# Patient Record
Sex: Male | Born: 1990 | Race: White | Hispanic: No | Marital: Married | Smoking: Never smoker
Health system: Southern US, Community
[De-identification: ages and names within clinical notes are randomized; demographics above are authoritative.]

## PROBLEM LIST (undated history)

## (undated) DIAGNOSIS — F419 Anxiety disorder, unspecified: Secondary | ICD-10-CM

## (undated) HISTORY — PX: LUMBAR SPINE SURGERY: SHX701

## (undated) HISTORY — PX: OTHER SURGICAL HISTORY: SHX169

## (undated) HISTORY — DX: Anxiety disorder, unspecified: F41.9

---

## 2012-11-22 ENCOUNTER — Encounter: Payer: Self-pay | Admitting: Family Medicine

## 2012-11-24 ENCOUNTER — Encounter: Payer: Self-pay | Admitting: Family Medicine

## 2013-11-24 ENCOUNTER — Emergency Department (HOSPITAL_COMMUNITY)
Admission: EM | Admit: 2013-11-24 | Discharge: 2013-11-24 | Disposition: A | Discharge status: Home / Self Care | Payer: Self-pay | Attending: Emergency Medicine | Admitting: Emergency Medicine

## 2013-11-24 ENCOUNTER — Encounter (HOSPITAL_COMMUNITY): Payer: Self-pay | Admitting: Emergency Medicine

## 2013-11-24 DIAGNOSIS — L03116 Cellulitis of left lower limb: Secondary | ICD-10-CM

## 2013-11-24 DIAGNOSIS — L02419 Cutaneous abscess of limb, unspecified: Secondary | ICD-10-CM | POA: Insufficient documentation

## 2013-11-24 DIAGNOSIS — L03119 Cellulitis of unspecified part of limb: Principal | ICD-10-CM

## 2013-11-24 MED ORDER — SULFAMETHOXAZOLE-TRIMETHOPRIM 800-160 MG PO TABS: 1.0000 | ORAL_TABLET | Freq: Two times a day (BID) | ORAL | Status: DC

## 2013-11-24 MED ORDER — CEPHALEXIN 500 MG PO CAPS: 500.0000 mg | ORAL_CAPSULE | Freq: Four times a day (QID) | ORAL | Status: DC

## 2013-11-24 MED ORDER — SULFAMETHOXAZOLE-TMP DS 800-160 MG PO TABS
1.0000 | ORAL_TABLET | Freq: Once | ORAL | Status: AC
  Administered 2013-11-24: 1 via ORAL
  Filled 2013-11-24: qty 1

## 2013-11-24 MED ORDER — CEPHALEXIN 250 MG PO CAPS
500.0000 mg | ORAL_CAPSULE | Freq: Once | ORAL | Status: AC
  Administered 2013-11-24: 500 mg via ORAL
  Filled 2013-11-24: qty 2

## 2013-11-24 NOTE — ED Provider Notes (Signed)
CSN: [id number]     Arrival date & time 11/24/13  1331 History   First MD Initiated Contact with Patient 11/24/13 1356    This chart was scribed for Francee Piccolo PA-C, a non-physician practitioner working with No att. providers found by Lewanda Rife, ED Scribe. This patient was seen in room TR09C/TR09C and the patient's care was started at 4:09 PM     Chief Complaint  Patient presents with  . Abscess     (Consider location/radiation/quality/duration/timing/severity/associated sxs/prior Treatment) The history is provided by the patient. No language interpreter was used.   HPI Comments: Matthew Andrews is a 23 y.o. male who presents to the Emergency Department complaining of abscess to left thigh onset 2 days. States he was seen at [facility] last night for the same. Reports associated occasional mild sanguinous drainage, and worsening redness. Denies any aggravating or alleviating factors. Denies associated insect bites, cough, fever, and chills. States he was prescribed an abx, but lost his prescription. Denies PMHx HIV, cancer, and DM.  History reviewed. No pertinent past medical history. No past surgical history on file. No family history on file. History  Substance Use Topics  . Smoking status: Not on file  . Smokeless tobacco: Not on file  . Alcohol Use: Not on file    Review of Systems  Constitutional: Negative for fever and chills.  Skin: Positive for color change.  All other systems reviewed and are negative.     Allergies  Review of patient's allergies indicates no known allergies.  Home Medications   Current Outpatient Rx  Name  Route  Sig  Dispense  Refill  . ibuprofen (ADVIL,MOTRIN) 200 MG tablet   Oral   Take 400-800 mg by mouth every 6 (six) hours as needed for fever, headache, mild pain, moderate pain or cramping.         . cephALEXin (KEFLEX) 500 MG capsule   Oral   Take 1 capsule (500 mg total) by mouth 4 (four) times daily.   40 capsule    0   . sulfamethoxazole-trimethoprim (SEPTRA DS) 800-160 MG per tablet   Oral   Take 1 tablet by mouth every 12 (twelve) hours.   20 tablet   0    BP 121/77  Pulse 85  Temp(Src) 98.5 F (36.9 C) (Oral)  Resp 20  SpO2 100% Physical Exam  Nursing note and vitals reviewed. Constitutional: He is oriented to person, place, and time. He appears well-developed and well-nourished. No distress.  HENT:  Head: Normocephalic and atraumatic.  Right Ear: External ear normal.  Left Ear: External ear normal.  Nose: Nose normal.  Mouth/Throat: Oropharynx is clear and moist.  Eyes: Conjunctivae are normal.  Neck: Normal range of motion. Neck supple.  Cardiovascular: Normal rate, regular rhythm, normal heart sounds and intact distal pulses.   Pulmonary/Chest: Effort normal and breath sounds normal. No respiratory distress. He has no wheezes. He has no rales.  Abdominal: Soft.  Musculoskeletal: Normal range of motion.  Lymphadenopathy:       Left: No inguinal adenopathy present.  Neurological: He is alert and oriented to person, place, and time.  Skin: Skin is warm and dry. He is not diaphoretic. There is erythema.  14 cm by 12 cm erythematous warm area on anterior left upper thigh. Non-TTP. No induration or fluctuance.   Psychiatric: He has a normal mood and affect.    ED Course  Procedures (including critical care time) COORDINATION OF CARE:  Nursing notes reviewed. Vital signs reviewed. Initial  pt interview and examination performed.   Filed Vitals:   11/24/13 1352  BP: 121/77  Pulse: 85  Temp: 98.5 F (36.9 C)  TempSrc: Oral  Resp: 20  SpO2: 100%    4:09 PM-Discussed work up plan with pt at bedside, which includes No orders of the defined types were placed in this encounter.  . Pt agrees with plan.   Treatment plan initiated: Medications  cephALEXin (KEFLEX) capsule 500 mg (500 mg Oral Given 11/24/13 1422)  sulfamethoxazole-trimethoprim (BACTRIM DS) 800-160 MG per tablet 1  tablet (1 tablet Oral Given 11/24/13 1422)     Initial diagnostic testing ordered.      Labs Review Labs Reviewed - No data to display Imaging Review No results found.   EKG Interpretation None      MDM   Final diagnoses:  Cellulitis of left leg    Filed Vitals:   11/24/13 1352  BP: 121/77  Pulse: 85  Temp: 98.5 F (36.9 C)  Resp: 20   Afebrile, NAD, non-toxic appearing, AAOx4.  Suspect uncomplicated cellulitis based on limited area of involvement, minimal pain, no systemic signs of illness (eg, fever, chills, dehydration, altered mental status, tachypnea, tachycardia, hypotension), no risk factors for serious illness (eg, extremes of age, general debility, immunocompromised status).   PE reveals redness, swelling, mildly tender, warm to touch. Skin intact, No bleeding. No bullae. Non purulent. Non circumferential.  Borders are not elevated or sharply demarcated.  Drew a line around the area of infection. Pt was instructed to return to the ED if area surpasses the boarder or pain intensifies.    Will prescribed Bactrim and Keflex to cover for MRSA, direct pt to apply warm compresses and to return to ED for I&D if pain should increase or abscess should develop. Advised return in 2 days for recheck as long as not worsening. Patient is agreeable to plan. Patient is stable at time of discharge   I personally performed the services described in this documentation, which was scribed in my presence. The recorded information has been reviewed and is accurate.       Jeannetta EllisJennifer L Domonic H[place]ox, PA-C 11/24/13 1615

## 2013-11-24 NOTE — Discharge Instructions (Signed)
Please follow up with your primary care physician in 1-2 days for a wound recheck. Or return earlier if you develop worsening redness past the line drawn. If you do not have one please call the [facility] number listed above. Please take your antibiotic until completion. Please read all discharge instructions and return precautions.   Cellulitis Cellulitis is an infection of the skin and the tissue beneath it. The infected area is usually red and tender. Cellulitis occurs most often in the arms and lower legs.  CAUSES  Cellulitis is caused by bacteria that enter the skin through cracks or cuts in the skin. The most common types of bacteria that cause cellulitis are Staphylococcus and Streptococcus. SYMPTOMS   Redness and warmth.  Swelling.  Tenderness or pain.  Fever. DIAGNOSIS  Your caregiver can usually determine what is wrong based on a physical exam. Blood tests may also be done. TREATMENT  Treatment usually involves taking an antibiotic medicine. HOME CARE INSTRUCTIONS   Take your antibiotics as directed. Finish them even if you start to feel better.  Keep the infected arm or leg elevated to reduce swelling.  Apply a warm cloth to the affected area up to 4 times per day to relieve pain.  Only take over-the-counter or prescription medicines for pain, discomfort, or fever as directed by your caregiver.  Keep all follow-up appointments as directed by your caregiver. SEEK MEDICAL CARE IF:   You notice red streaks coming from the infected area.  Your red area gets larger or turns dark in color.  Your bone or joint underneath the infected area becomes painful after the skin has healed.  Your infection returns in the same area or another area.  You notice a swollen bump in the infected area.  You develop new symptoms. SEEK IMMEDIATE MEDICAL CARE IF:   You have a fever.  You feel very sleepy.  You develop vomiting or diarrhea.  You have a general  ill feeling (malaise) with muscle aches and pains. MAKE SURE YOU:   Understand these instructions.  Will watch your condition.  Will get help right away if you are not doing well or get worse. Document Released: 05/13/2005 Document Revised: 02/02/2012 Document Reviewed: 10/19/2011 [facility] Patient Information 2014 [place].

## 2013-11-24 NOTE — ED Notes (Signed)
Abscess left thigh (his second in one month). [facility] and was given an Rx for abx but pt lost the Rx.

## 2013-11-24 NOTE — ED Notes (Signed)
Pt in c/o possible abscess to left thigh, states he was [facility] for same and prescribed an antibiotic but lost his prescription, in today due to continued pain

## 2013-11-27 NOTE — ED Provider Notes (Signed)
Medical screening examination/treatment/procedure(s) were performed by non-physician practitioner and as supervising physician I was immediately available for consultation/collaboration.   EKG Interpretation None        Gwyneth SproutWhitney Cambelle Suchecki, MD 11/27/13 0745

## 2017-04-26 DIAGNOSIS — M47812 Spondylosis without myelopathy or radiculopathy, cervical region: Secondary | ICD-10-CM | POA: Insufficient documentation

## 2017-04-26 DIAGNOSIS — M4306 Spondylolysis, lumbar region: Secondary | ICD-10-CM | POA: Insufficient documentation

## 2017-06-29 DIAGNOSIS — R319 Hematuria, unspecified: Secondary | ICD-10-CM | POA: Diagnosis not present

## 2017-06-29 DIAGNOSIS — R1011 Right upper quadrant pain: Secondary | ICD-10-CM | POA: Diagnosis not present

## 2018-01-10 DIAGNOSIS — Z5321 Procedure and treatment not carried out due to patient leaving prior to being seen by health care provider: Secondary | ICD-10-CM | POA: Diagnosis not present

## 2018-01-10 DIAGNOSIS — R109 Unspecified abdominal pain: Secondary | ICD-10-CM | POA: Diagnosis not present

## 2018-02-01 DIAGNOSIS — K529 Noninfective gastroenteritis and colitis, unspecified: Secondary | ICD-10-CM | POA: Diagnosis not present

## 2018-02-01 DIAGNOSIS — R51 Headache: Secondary | ICD-10-CM | POA: Diagnosis not present

## 2018-02-01 DIAGNOSIS — Z79899 Other long term (current) drug therapy: Secondary | ICD-10-CM | POA: Diagnosis not present

## 2018-03-10 DIAGNOSIS — W19XXXA Unspecified fall, initial encounter: Secondary | ICD-10-CM | POA: Diagnosis not present

## 2018-03-10 DIAGNOSIS — S299XXA Unspecified injury of thorax, initial encounter: Secondary | ICD-10-CM | POA: Diagnosis not present

## 2018-03-10 DIAGNOSIS — E7800 Pure hypercholesterolemia, unspecified: Secondary | ICD-10-CM | POA: Diagnosis not present

## 2018-03-10 DIAGNOSIS — R0781 Pleurodynia: Secondary | ICD-10-CM | POA: Diagnosis not present

## 2018-03-10 DIAGNOSIS — R0789 Other chest pain: Secondary | ICD-10-CM | POA: Diagnosis not present

## 2018-03-10 DIAGNOSIS — Z79899 Other long term (current) drug therapy: Secondary | ICD-10-CM | POA: Diagnosis not present

## 2018-05-02 DIAGNOSIS — Z79899 Other long term (current) drug therapy: Secondary | ICD-10-CM | POA: Diagnosis not present

## 2018-05-02 DIAGNOSIS — G43909 Migraine, unspecified, not intractable, without status migrainosus: Secondary | ICD-10-CM | POA: Diagnosis not present

## 2018-05-02 DIAGNOSIS — R51 Headache: Secondary | ICD-10-CM | POA: Diagnosis not present

## 2018-05-02 DIAGNOSIS — G43009 Migraine without aura, not intractable, without status migrainosus: Secondary | ICD-10-CM | POA: Diagnosis not present

## 2018-08-29 DIAGNOSIS — E782 Mixed hyperlipidemia: Secondary | ICD-10-CM | POA: Diagnosis not present

## 2018-08-29 DIAGNOSIS — I10 Essential (primary) hypertension: Secondary | ICD-10-CM | POA: Diagnosis not present

## 2018-08-29 DIAGNOSIS — K2971 Gastritis, unspecified, with bleeding: Secondary | ICD-10-CM | POA: Diagnosis not present

## 2018-08-29 DIAGNOSIS — L0390 Cellulitis, unspecified: Secondary | ICD-10-CM | POA: Diagnosis not present

## 2018-09-17 DIAGNOSIS — R509 Fever, unspecified: Secondary | ICD-10-CM | POA: Diagnosis not present

## 2018-09-17 DIAGNOSIS — I10 Essential (primary) hypertension: Secondary | ICD-10-CM | POA: Diagnosis not present

## 2018-09-17 DIAGNOSIS — R05 Cough: Secondary | ICD-10-CM | POA: Diagnosis not present

## 2018-09-17 DIAGNOSIS — J069 Acute upper respiratory infection, unspecified: Secondary | ICD-10-CM | POA: Diagnosis not present

## 2019-01-17 DIAGNOSIS — Z6824 Body mass index (BMI) 24.0-24.9, adult: Secondary | ICD-10-CM | POA: Diagnosis not present

## 2019-01-17 DIAGNOSIS — L309 Dermatitis, unspecified: Secondary | ICD-10-CM | POA: Diagnosis not present

## 2019-04-18 DIAGNOSIS — Z6823 Body mass index (BMI) 23.0-23.9, adult: Secondary | ICD-10-CM | POA: Diagnosis not present

## 2019-04-18 DIAGNOSIS — M542 Cervicalgia: Secondary | ICD-10-CM | POA: Diagnosis not present

## 2019-04-18 DIAGNOSIS — M25562 Pain in left knee: Secondary | ICD-10-CM | POA: Diagnosis not present

## 2019-04-27 ENCOUNTER — Ambulatory Visit: Payer: Self-pay | Admitting: Orthopaedic Surgery

## 2019-04-27 ENCOUNTER — Other Ambulatory Visit: Payer: Self-pay

## 2019-05-24 ENCOUNTER — Other Ambulatory Visit: Payer: Self-pay

## 2019-05-24 ENCOUNTER — Ambulatory Visit: Payer: BC Managed Care – PPO | Admitting: Family Medicine

## 2019-05-24 ENCOUNTER — Encounter: Payer: Self-pay | Admitting: Family Medicine

## 2019-05-24 VITALS — BP 120/76 | HR 90 | Temp 98.5°F | Resp 15 | Ht 72.0 in | Wt 172.0 lb

## 2019-05-24 DIAGNOSIS — R1010 Upper abdominal pain, unspecified: Secondary | ICD-10-CM

## 2019-05-24 DIAGNOSIS — Z114 Encounter for screening for human immunodeficiency virus [HIV]: Secondary | ICD-10-CM

## 2019-05-24 DIAGNOSIS — R197 Diarrhea, unspecified: Secondary | ICD-10-CM | POA: Diagnosis not present

## 2019-05-24 DIAGNOSIS — Z23 Encounter for immunization: Secondary | ICD-10-CM | POA: Diagnosis not present

## 2019-05-24 DIAGNOSIS — K219 Gastro-esophageal reflux disease without esophagitis: Secondary | ICD-10-CM | POA: Diagnosis not present

## 2019-05-24 DIAGNOSIS — R002 Palpitations: Secondary | ICD-10-CM

## 2019-05-24 MED ORDER — PANTOPRAZOLE SODIUM 40 MG PO TBEC: 40.0000 mg | DELAYED_RELEASE_TABLET | Freq: Every day | ORAL | 3 refills | Status: DC

## 2019-05-24 NOTE — Patient Instructions (Signed)
    I appreciate the opportunity to provide you with the care for your health and wellness. Today we discussed: overall health  Follow up: 2 weeks phone   Labs ordered today, please get these fasting (nothing to eat by mouth for 8 hours). [facility] is beside our office same floor of building.  Please sign release forms for both your dr in [facility] and [facility]   Work on water intake. Get Protonix take daily, 1 hour before eating anything.  Please continue to practice social distancing to keep you, your family, and our community safe.  If you must go out, please wear a mask and practice good handwashing.  Oasis YOUR HANDS WELL AND FREQUENTLY. AVOID TOUCHING YOUR FACE, UNLESS YOUR HANDS ARE FRESHLY WASHED.  GET FRESH AIR DAILY. STAY HYDRATED WITH WATER.   It was a pleasure to see you and I look forward to continuing to work together on your health and well-being. Please do not hesitate to call the office if you need care or have questions about your care.  Have a wonderful day and week. With Gratitude, Cherly Beach, DNP, AGNP-BC

## 2019-05-24 NOTE — Progress Notes (Signed)
Subjective:     [name], male   DOB: [birth year], 28 y.o.   MRN: [id number]  Matthew Andrews presents for New Patient (Initial Visit) (establish care)  [name] is a 28 year old male patient who presents today to [facility] care [facility].  Had his previous PCP and eating.  Felt like he just needed a change.  History is consistent with anxiety, use of diltiazem-he is unsure exactly why he was put on this at this time.  Knee surgery, back surgery, stomach pains. Family history is consistent with dad passing away of melanoma at a young age.  Not a smoker, occasional alcohol use but not as much as he used to secondary to some abdominal pain he is been experiencing.  No illicit drug use.  Needs to get flu vaccine, tetanus vaccine.  Works as a Chief Financial Officer for [employer].  Is married has 2 children under the age of 5 Currently walks to run every 5 days for 30 minutes.  Some stress but overall not much.  Has a good social network.  Enjoys watching football spending time with family being outside.  Has 2 pets at home.  Enjoys eating does not really limit anything that he eats reports that he does like to eat junk food like chips and such.  Was eating spicy food but has stopped that since he has had abdominal pain.  Does not drink enough water he knows per day.  And will drink 2 to 3 glasses of tea when he is at home for dinner.  Biggest complaint today is that he has upper abdominal discomfort.  Up underneath rib cage.  Feels sharp and can bring him to his knees when it occurs.  He reports that it happens every day especially after eating spicy foods.  He denies any changes in bowel habits.  But reports that he is noticed that he has more loose stools or more stools frequently.  He notices them being sometimes green or clay colored.  No bleeding that he is noted.  He has had stomach issues in the past.  Reports that he was not told about acid reflux or  GERD.  Is not been taking anything for it.  He is unsure of what is going on but he knows that he has a lot of discomfort and pain and it is in his been in some of his daily events.  And making it hard to go to work.  He is unsure when his last labs were.  He has been tested for hepatitis  He reports that those were negative.  He reports having some knee problems but at this time he just wants to figure out his abdominal pain prior to his knee he says his knee is doable and not having as much discomfort as his abdomen is been given him.  Today patient denies signs and symptoms of COVID 19 infection including fever, chills, cough, shortness of breath, and headache.  Past Medical, Surgical, Social History, Allergies, and Medications have been Reviewed.   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 strain: Not hard at all   Food insecurity    Worry: Never true    Inability: Never true   Transportation needs    Medical: No    Non-medical: No  Tobacco Use   Smoking status: Never Smoker   Smokeless tobacco: Never Used  Substance and Sexual Activity   Alcohol use: Yes   Drug use: Never   Sexual activity: Yes  Lifestyle   Physical activity    Days per week: 5 days    Minutes per session: 30 min   Stress: To some extent  Relationships   Social connections    Talks on phone: More than three times a week    Gets together: More than three times a week    Attends religious service: More than 4 times per year    Active member of club or  organization: No    Attends meetings of clubs or organizations: Never    Relationship status: Married   Intimate partner violence    Fear of current or ex partner: No    Emotionally abused: No    Physically abused: No    Forced sexual activity: No  Other Topics Concern   Not on file  Social History Narrative   Lives with wife Jarrett Soho married a year in half, dated for 5 years before    2 children   Daughter 44- 79    Son 2- Biochemist, clinical       Pets: 2 dogs: American Bully: Cleo   Mutt: Dotson Mix: Pluto       Enjoys: watching football, outside, family and friends      Diet: junk food-chips, red meat, chicken, veggies and fruits, salads, was eating a lot of spicy foods, but trying to reduced   Water: 1 bottle a day   Caffiene: tea 2-3       Wears seat belt    Wears sun protection   Smoke detectors    Does not use phone while driving    Outpatient Encounter Medications as of 05/24/2019  Medication Sig   CARTIA XT 120 MG 24 hr capsule Take 120 mg by mouth daily.   pantoprazole (PROTONIX) 40 MG tablet Take 1 tablet (40 mg total) by mouth daily.   [DISCONTINUED] cephALEXin (KEFLEX) 500 MG capsule Take 1 capsule (500 mg total) by mouth 4 (four) times daily. (Patient not taking: Reported on 05/24/2019)   [DISCONTINUED] ibuprofen (ADVIL,MOTRIN) 200 MG tablet Take 400-800 mg by mouth every 6 (six) hours as needed for fever, headache, mild pain, moderate pain or cramping.   [DISCONTINUED] sulfamethoxazole-trimethoprim (SEPTRA DS) 800-160 MG per tablet Take 1 tablet by mouth every 12 (twelve) hours. (Patient not taking: Reported on 05/24/2019)   No facility-administered encounter medications on file as of 05/24/2019.    No Known Allergies  Review of Systems  Constitutional: Negative for chills and fever.  HENT: Negative.   Eyes: Negative.   Respiratory: Negative.   Cardiovascular: Negative.   Gastrointestinal: Positive for abdominal pain and diarrhea. Negative for blood in stool,  nausea, rectal pain and vomiting.  Endocrine: Negative.   Genitourinary: Negative.   Musculoskeletal: Positive for arthralgias.  Skin: Negative.   Allergic/Immunologic: Negative.   Neurological: Negative.   Hematological: Negative.   Psychiatric/Behavioral: Negative.   All other systems reviewed and are negative.  Objective:     BP 120/76    Pulse 90    Temp 98.5 F (36.9 C) (Oral)    Resp 15    Ht 6' (1.829 m)    Wt 172 lb 0.6 oz (78 kg)    SpO2 95%    BMI 23.33 kg/m   Physical Exam Vitals signs and nursing note reviewed.  Constitutional:      Appearance: Normal appearance. He is well-developed and well-groomed. He is obese.  HENT:     Head: Normocephalic and atraumatic.     Right Ear: External ear normal.     Left Ear: External ear normal.     Nose: Nose normal.     Mouth/Throat:     Mouth: Mucous membranes are moist.     Pharynx: Oropharynx is clear.  Eyes:     General:        Right eye: No discharge.        Left eye: No discharge.     Conjunctiva/sclera: Conjunctivae normal.  Neck:     Musculoskeletal: Normal range of motion and neck supple.  Cardiovascular:     Rate and Rhythm: Normal rate and regular rhythm.     Pulses: Normal pulses.     Heart sounds: Normal heart sounds.  Pulmonary:     Effort: Pulmonary effort is normal.     Breath sounds: Normal breath sounds.  Abdominal:     General: Abdomen is flat. Bowel sounds are normal. There is no distension.     Palpations: Abdomen is soft. There is no mass.     Tenderness: There is no abdominal tenderness. There is no right CVA tenderness, left CVA tenderness, guarding or rebound.     Hernia: No hernia is present.  Musculoskeletal: Normal range of motion.  Skin:    General: Skin is warm.  Neurological:     General: No focal deficit present.     Mental Status: He is alert and oriented to person, place, and time.  Psychiatric:        Attention and Perception: Attention normal.        Mood and Affect:  Mood normal.        Speech: Speech normal.        Behavior: Behavior normal. Behavior is cooperative.        Thought Content: Thought content normal.        Cognition and Memory: Cognition normal.        Judgment: Judgment normal.    EKG in office demonstrated normal sinus rhythm vent rate at 77 bpm.  Normal ECG.  Reviewed personally by myself.    Assessment and Plan        1. Gastroesophageal reflux disease, unspecified whether esophagitis present Abdominal pain could be related to GERD as he reports spicy foods and fried foods cause the issue as well.  We will be trying him on Protonix once daily.  Can increase this to twice daily if it has benefit but does not fully subside.  We will be checking labs if elevation in liver enzymes will look for addition of pancreatic enzymes to see if that is an issue.  Otherwise we will see if Protonix helps if not we will get ultrasound if needed.  - pantoprazole (PROTONIX) 40 MG tablet; Take 1 tablet (40 mg total) by mouth daily.  Dispense: 30 tablet; Refill: 3 - CBC - COMPLETE METABOLIC PANEL WITH GFR  2. Pain of upper abdomen Unable to elicit the upper abdominal pain.  He reports that is not having any right now in the office.  He reports that he has some in the morning.  Predominantly and after eating certain foods.  He does know spicy foods and fried foods have triggered it.  Questionable gallbladder issue.  Will try to treat for GERD and if that does not help possibly looking at getting an ultrasound to check gallbladder and or inflammation of gallbladder liver and pancreatitis.  Checking labs if elevation in liver enzymes will look for pancreatic attic enzymes added on.  - CBC - COMPLETE METABOLIC PANEL WITH GFR - Lipid panel  3. Encounter for screening for HIV US task force recommendation.  - HIV Antibody (routine testing w rflx)  4. Diarrhea, unspecified type Diarrhea possibly related to gallbladder issues, GERD issues, irritable bowel  issues.  He is advised to come to keep up with food that might be causing it, emotional response or stress that might be causing it use of alcohol that might be causing it or any of thing that might seem like it is triggering it.  He was started on Protonix today possibly to rule out GERD.  - CBC - COMPLETE METABOLIC PANEL WITH GFR  5. Need for immunization against influenza Patient was educated on the recommendation for flu vaccine. After obtaining informed consent, the vaccine was administered no adverse effects noted at time of administration. Patient provided with education on arm soreness and use of tylenol or ibuprofen (if safe) for this. Encourage to use the arm vaccine was given in to help reduce the soreness. Patient educated on the signs of a reaction to the vaccine and advised to contact the office should these occur.     - Flu Vaccine QUAD 36+ mos IM  6. Fluttering sensation of heart Ordered EKG today he reports that he takes the diltiazem daily 120 mg he is unsure if he is taking this because he has had a heart condition or if it is for anxiety.  He is not sure he has been taking it now for a year or more.  He reports unsure of any recent EKGs.  Reports that he had some thickening of the wall he thinks.  Will need to get records from Dr. Roseanne Ren[place]Hassan [place] in [place].     Return in about 2 weeks (around 06/07/2019) for GERD follow up.  Freddy FinnerHannah M. Ignace Mandigo, DNP, AGNP-BC [facility] [address], [address] [place], [place] [address] Office Hours: Mon-Thurs 8 am-5 pm; Fri 8 am-12 pm Office Phone:  [phone]  Office Fax: [phone]

## 2019-05-25 ENCOUNTER — Other Ambulatory Visit: Payer: Self-pay | Admitting: Family Medicine

## 2019-05-25 ENCOUNTER — Encounter: Payer: Self-pay | Admitting: Family Medicine

## 2019-05-25 DIAGNOSIS — R1010 Upper abdominal pain, unspecified: Secondary | ICD-10-CM

## 2019-05-25 LAB — COMPLETE METABOLIC PANEL WITH GFR
AG Ratio: 2.3 (calc) (ref 1.0–2.5)
ALT: 16 U/L (ref 9–46)
AST: 22 U/L (ref 10–40)
Albumin: 4.9 g/dL (ref 3.6–5.1)
Alkaline phosphatase (APISO): 66 U/L (ref 36–130)
BUN: 17 mg/dL (ref 7–25)
CO2: 30 mmol/L (ref 20–32)
Calcium: 9.9 mg/dL (ref 8.6–10.3)
Chloride: 104 mmol/L (ref 98–110)
Creat: 0.75 mg/dL (ref 0.60–1.35)
GFR, Est African American: 144 mL/min/{1.73_m2} (ref >=60)
GFR, Est Non African American: 124 mL/min/{1.73_m2} (ref >=60)
Globulin: 2.1 g/dL (calc) (ref 1.9–3.7)
Glucose, Bld: 93 mg/dL (ref 65–99)
Potassium: 4.7 mmol/L (ref 3.5–5.3)
Sodium: 141 mmol/L (ref 135–146)
Total Bilirubin: 0.3 mg/dL (ref 0.2–1.2)
Total Protein: 7 g/dL (ref 6.1–8.1)

## 2019-05-25 LAB — CBC
HCT: 42.3 % (ref 38.5–50.0)
Hemoglobin: 14.4 g/dL (ref 13.2–17.1)
MCH: 31.3 pg (ref 27.0–33.0)
MCHC: 34 g/dL (ref 32.0–36.0)
MCV: 92 fL (ref 80.0–100.0)
MPV: 10.3 fL (ref 7.5–12.5)
Platelets: 285 10*3/uL (ref 140–400)
RBC: 4.6 10*6/uL (ref 4.20–5.80)
RDW: 12.7 % (ref 11.0–15.0)
WBC: 5.1 10*3/uL (ref 3.8–10.8)

## 2019-05-25 LAB — LIPID PANEL
Cholesterol: 191 mg/dL (ref <200)
HDL: 39 mg/dL — ABNORMAL LOW (ref >=40)
LDL Cholesterol (Calc): 105 mg/dL (calc) — ABNORMAL HIGH
Non-HDL Cholesterol (Calc): 152 mg/dL (calc) — ABNORMAL HIGH (ref <130)
Total CHOL/HDL Ratio: 4.9 (calc) (ref <5.0)
Triglycerides: 353 mg/dL — ABNORMAL HIGH (ref <150)

## 2019-05-25 LAB — HIV ANTIBODY (ROUTINE TESTING W REFLEX): HIV 1&2 Ab, 4th Generation: NONREACTIVE

## 2019-05-25 NOTE — Progress Notes (Signed)
Please call Matthew Andrews and ask if he got these lab fasting, as the cholesterol levels are elevated and that might be why. If so, I will replace the lipid panel to be fasting and he can get sometime over the next week. Kidney and liver function look great though.  Blood levels are in good range to. HIV neg.

## 2019-06-01 ENCOUNTER — Ambulatory Visit: Payer: BC Managed Care – PPO | Admitting: Surgery

## 2019-06-01 ENCOUNTER — Other Ambulatory Visit: Payer: Self-pay

## 2019-06-07 ENCOUNTER — Other Ambulatory Visit: Payer: Self-pay

## 2019-06-07 ENCOUNTER — Ambulatory Visit (INDEPENDENT_AMBULATORY_CARE_PROVIDER_SITE_OTHER): Payer: BC Managed Care – PPO | Admitting: Family Medicine

## 2019-06-07 ENCOUNTER — Encounter: Payer: Self-pay | Admitting: Family Medicine

## 2019-06-07 DIAGNOSIS — M25562 Pain in left knee: Secondary | ICD-10-CM | POA: Diagnosis not present

## 2019-06-07 DIAGNOSIS — K219 Gastro-esophageal reflux disease without esophagitis: Secondary | ICD-10-CM

## 2019-06-07 DIAGNOSIS — R002 Palpitations: Secondary | ICD-10-CM | POA: Diagnosis not present

## 2019-06-07 DIAGNOSIS — G8929 Other chronic pain: Secondary | ICD-10-CM | POA: Diagnosis not present

## 2019-06-07 NOTE — Progress Notes (Signed)
Virtual Visit via Telephone Note   This visit type was conducted due to national recommendations for restrictions regarding the COVID-19 Pandemic (e.g. social distancing) in an effort to limit this patient's exposure and mitigate transmission in our community.  Due to his co-morbid illnesses, this patient is at least at moderate risk for complications without adequate follow up.  This format is felt to be most appropriate for this patient at this time.  The patient did not have access to video technology/had technical difficulties with video requiring transitioning to audio format only (telephone).  All issues noted in this document were discussed and addressed.  No physical exam could be performed with this format.   Evaluation Performed:  Follow-up visit  Date:  06/08/2019   ID:  Matthew Andrews, DOB 02-05-1991, MRN [id number]  Patient Location: Home Provider Location: Office  Location of Patient: Home Location of Provider: Telehealth Consent was obtain for visit to be over via telehealth. I verified that I am speaking with the correct person using two identifiers.  PCP:  Perlie Mayo, NP   Chief Complaint:  follow up for GERD and knee pain   History of Present Illness:    Matthew Andrews is a 28 y.o. male with history of gerd, knee pain, and previous back surgeries. Here today for follow up on GERD and knee pain. Today he reports that his stomach pains have gotten much better.  He reports he is taking his Protonix as directed.  He does have some tingling-like sensations where it normally would give him good cramping discomfort.  But otherwise it never gets bad like it was.  He denies having any nausea and vomiting.  Reports that his stools are better diarrhea is getting better.  He is also trying to eat much better.    Knee pain: declined referral at this time, reports that it is tolerable he ices it and practices the rice measures reviewed.  The patient does not have symptoms  concerning for COVID-19 infection (fever, chills, cough, or new shortness of breath).   Past Medical, Surgical, Social History, Allergies, and Medications have been Reviewed.   Past Medical History:  Diagnosis Date  . Anxiety    Past Surgical History:  Procedure Laterality Date  . knee surgery Left   . LUMBAR SPINE SURGERY     2014     Current Meds  Medication Sig  . pantoprazole (PROTONIX) 40 MG tablet Take 1 tablet (40 mg total) by mouth daily.  . [DISCONTINUED] CARTIA XT 120 MG 24 hr capsule Take 120 mg by mouth daily.     Allergies:   Patient has no known allergies.   Social History   Tobacco Use  . Smoking status: Never Smoker  . Smokeless tobacco: Never Used  Substance Use Topics  . Alcohol use: Yes  . Drug use: Never     Family Hx: The patient's family history includes Cancer in his father.  ROS:   Please see the history of present illness.     All other systems reviewed and are negative.   Labs/Other Tests and Data Reviewed:     Recent Labs: 05/24/2019: ALT 16; BUN 17; Creat 0.75; Hemoglobin 14.4; Platelets 285; Potassium 4.7; Sodium 141   Recent Lipid Panel Lab Results  Component Value Date/Time   CHOL 191 05/24/2019 04:20 PM   TRIG 353 (H) 05/24/2019 04:20 PM   HDL 39 (L) 05/24/2019 04:20 PM   CHOLHDL 4.9 05/24/2019 04:20 PM   LDLCALC  105 (H) 05/24/2019 04:20 PM    Wt Readings from Last 3 Encounters:  05/24/19 172 lb 0.6 oz (78 kg)     Objective:    Vital Signs:  There were no vitals taken for this visit.   GEN:  Alert and oriented RESPIRATORY:  No shortness of breath noted in conversation PSYCH:  Normal affect and mood  ASSESSMENT & PLAN:     1. Gastroesophageal reflux disease, unspecified whether esophagitis present Much improved continue PPI  2. Chronic pain of left knee Declines referral at this time.  Reports that he is icing and trying to manage it at home.  3. Fluttering sensation of heart Reports stopping the diltiazem  has not had any fluttering sensations of his heart rate.  And does not feel any worse since stopping the medication this was per his decision.  Advised for him to contact the office if he starts to feel bad.  He does not remember why he was put on in the first place.   Time:   Today, I have spent 10 minutes with the patient with telehealth technology discussing the above problems.     Medication Adjustments/Labs and Tests Ordered: Current medicines are reviewed at length with the patient today.  Concerns regarding medicines are outlined above.   Tests Ordered: No orders of the defined types were placed in this encounter.   Medication Changes: No orders of the defined types were placed in this encounter.   Disposition:  Follow up 2 week phone  Signed, Freddy Finner, NP  06/08/2019 12:12 PM     [facility]

## 2019-06-07 NOTE — Patient Instructions (Addendum)
    I appreciate the opportunity to provide you with the care for your health and wellness. Today we discussed:  GERD and knee pain  Follow up: 2 weeks by phone   No labs or referrals today  I am so glad the medication is help the stomach pains you had. I hope they continue to improve. This is great news. We will check back in with this in 2 weeks.  Continue to practice RICE measures for your knee pain, we can look at referral for knee if needed.  Apply a compressive ACE bandage. Rest and elevate the affected painful area.  Apply cold compresses intermittently as needed.  As pain recedes, begin normal activities slowly as tolerated.  Call if symptoms persist.  It was a pleasure to see you and I look forward to continuing to work together on your health and well-being. Please do not hesitate to call the office if you need care or have questions about your care.  Have a wonderful day and week. With Gratitude, Cherly Beach, DNP, AGNP-BC

## 2019-06-08 ENCOUNTER — Encounter: Payer: Self-pay | Admitting: Family Medicine

## 2019-06-08 DIAGNOSIS — K219 Gastro-esophageal reflux disease without esophagitis: Secondary | ICD-10-CM | POA: Insufficient documentation

## 2019-06-14 ENCOUNTER — Other Ambulatory Visit: Payer: Self-pay

## 2019-06-14 ENCOUNTER — Ambulatory Visit (HOSPITAL_COMMUNITY)
Admission: RE | Admit: 2019-06-14 | Discharge: 2019-06-14 | Disposition: A | Discharge status: Home / Self Care | Payer: BC Managed Care – PPO | Source: Ambulatory Visit | Attending: Family Medicine | Admitting: Family Medicine

## 2019-06-14 ENCOUNTER — Encounter: Payer: Self-pay | Admitting: Family Medicine

## 2019-06-14 ENCOUNTER — Ambulatory Visit: Payer: BC Managed Care – PPO | Admitting: Family Medicine

## 2019-06-14 VITALS — BP 120/70 | HR 83 | Temp 97.7°F | Resp 15 | Ht 72.0 in | Wt 179.1 lb

## 2019-06-14 DIAGNOSIS — M4306 Spondylolysis, lumbar region: Secondary | ICD-10-CM

## 2019-06-14 DIAGNOSIS — M4327 Fusion of spine, lumbosacral region: Secondary | ICD-10-CM

## 2019-06-14 DIAGNOSIS — M546 Pain in thoracic spine: Secondary | ICD-10-CM | POA: Diagnosis not present

## 2019-06-14 DIAGNOSIS — M2569 Stiffness of other specified joint, not elsewhere classified: Secondary | ICD-10-CM

## 2019-06-14 DIAGNOSIS — M549 Dorsalgia, unspecified: Secondary | ICD-10-CM

## 2019-06-14 DIAGNOSIS — G8929 Other chronic pain: Secondary | ICD-10-CM

## 2019-06-14 DIAGNOSIS — M545 Low back pain: Secondary | ICD-10-CM | POA: Diagnosis not present

## 2019-06-14 NOTE — Progress Notes (Signed)
Good news, no noted changes throughout the back. Overall everything appeared normal. I would think that due to no changes in spine, this is more muscle related. We will await labs, but  If they are normal we should look at PT to help.

## 2019-06-14 NOTE — Progress Notes (Signed)
Subjective:     [name], male   DOB: 14-Mar-1991, 28 y.o.   MRN: [id number]  Matthew Andrews presents for Back Pain (majority of pain is right in the middle of the back.)  Matthew Andrews is a 28 year old male patient who has had a previous surgery for L5-S1 transforaminal lumbar in-interbody fusion from the right side with supplemental left-sided pedicle screw fixation.  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  Had pars defect of L5 and was symptomatic with bilateral lower extremity pain.  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  Additionally he had subtle disc bulge at L4-5 without impingement CT from 2014 procedure performed at [facility] by Dr. Carloyn Manner.  Presents today in the office reports that he try not had pain off again back pain since his surgery 7 years ago.  Reports that it is mostly in his mid back.  Reports a lot of stiffness which occurs with a lot in the morning time.  There is some sharp pain at times.  And some spots in his back to just feel like he needs to have his "bones popped" to relieve the discomfort.  He reports this is interfering with his ability to interact with his children as he just feels so stiff that he cannot move.  Recently went on a trip and was unable to interact with them throughout most of the trip because of this.  Does not recall if he has a family history of autoimmune or ankylosing spondylitis.  Chronic back discomfort.  Location in the entire spine predominantly right now in the mid back.  Does not really have any radiation into the buttocks or legs at this time.  Reports more of a discomfort than pain. It has started to disturb daily life causing it to be more difficult for him to interact with his  children as stated above and limiting movement. It is relieved by rest. It is aggravated by movement rest as he gets stiff. Modifying factors have included medication, rest, modification of daily events.  There is no associated lower extremity numbness or weakness. There is no associated incontinence of stool or urine.  Today patient denies signs and symptoms of COVID 19 infection including fever, chills, cough, shortness of breath, and headache.  Past Medical, Surgical, Social History, Allergies, and Medications have been Reviewed.  Past Medical History:  Diagnosis Date  . Anxiety    Past Surgical History:  Procedure Laterality Date  . knee surgery Left   . LUMBAR SPINE SURGERY     2014   Social History   Socioeconomic History  . Marital status: Married    Spouse name: Misael Mcgaha   . Number of children: 2  . Years of education: Not on file  . Highest education level: High school graduate  Occupational History  . Occupation: Pensions consultant    Comment: tree Orthoptist   Social Needs  . Financial resource strain: Not hard at all  . Food insecurity    Worry: Never true    Inability: Never true  . Transportation needs    Medical: No    Non-medical: No  Tobacco Use  . Smoking status: Never Smoker  . Smokeless tobacco: Never Used  Substance and Sexual Activity  . Alcohol  use: Yes  . Drug use: Never  . Sexual activity: Yes  Lifestyle  . Physical activity    Days per week: 5 days    Minutes per session: 30 min  . Stress: To some extent  Relationships  . Social connections    Talks on phone: More than three times a week    Gets together: More than three times a week    Attends religious service: More than 4 times per year    Active member of club or organization: No    Attends meetings of clubs or organizations: Never    Relationship status: Married  . Intimate partner violence    Fear of current or ex partner: No    Emotionally abused: No    Physically abused:  No    Forced sexual activity: No  Other Topics Concern  . Not on file  Social History Narrative   Lives with wife Jarrett Soho married a year in half, dated for 5 years before    2 children   Daughter 76- 64    Son 2- Biochemist, clinical       Pets: 2 dogs: American Bully: Cleo   Mutt: Dotson Mix: Pluto       Enjoys: watching football, outside, family and friends      Diet: junk food-chips, red meat, chicken, veggies and fruits, salads, was eating a lot of spicy foods, but trying to reduced   Water: 1 bottle a day   Caffiene: tea 2-3       Wears seat belt    Wears sun protection   Smoke detectors    Does not use phone while driving    Outpatient Encounter Medications as of 06/14/2019  Medication Sig  . pantoprazole (PROTONIX) 40 MG tablet Take 1 tablet (40 mg total) by mouth daily.   No facility-administered encounter medications on file as of 06/14/2019.    No Known Allergies  Review of Systems  Constitutional: Negative for chills and fever.  HENT: Negative.   Eyes: Negative.   Respiratory: Negative.   Cardiovascular: Negative.   Gastrointestinal: Negative.   Endocrine: Negative.   Genitourinary: Negative.   Musculoskeletal: Positive for arthralgias, back pain and neck stiffness.  Skin: Negative.   Allergic/Immunologic: Negative.   Neurological: Negative.   Hematological: Negative.   Psychiatric/Behavioral: Negative.   All other systems reviewed and are negative.      Objective:     BP 120/70   Pulse 83   Temp 97.7 F (36.5 C) (Oral)   Resp 15   Ht 6' (1.829 m)   Wt 179 lb 1.3 oz (81.2 kg)   SpO2 98%   BMI 24.29 kg/m   Physical Exam Vitals signs and nursing note reviewed.  Constitutional:      Appearance: Normal appearance. He is well-developed, well-groomed and normal weight.  HENT:     Head: Normocephalic and atraumatic.     Right Ear: External ear normal.     Left Ear: External ear normal.     Nose: Nose normal.     Mouth/Throat:     Pharynx: Oropharynx  is clear.  Eyes:     General:        Right eye: No discharge.        Left eye: No discharge.     Conjunctiva/sclera: Conjunctivae normal.  Neck:     Musculoskeletal: Normal range of motion and neck supple.  Cardiovascular:     Rate and Rhythm: Normal rate and regular rhythm.  Pulses: Normal pulses.     Heart sounds: Normal heart sounds.  Pulmonary:     Effort: Pulmonary effort is normal.     Breath sounds: Normal breath sounds.  Musculoskeletal: Normal range of motion.     Lumbar back: He exhibits tenderness and pain.     Comments: ROM slightly limited, but intact  Skin:    General: Skin is warm.  Neurological:     General: No focal deficit present.     Mental Status: He is alert and oriented to person, place, and time.     Cranial Nerves: Cranial nerves are intact.     Sensory: Sensation is intact.     Motor: Motor function is intact.     Coordination: Coordination is intact.     Gait: Gait is intact.  Psychiatric:        Attention and Perception: Attention normal.        Mood and Affect: Mood and affect normal.        Speech: Speech normal.        Behavior: Behavior normal. Behavior is cooperative.        Thought Content: Thought content normal.        Cognition and Memory: Cognition normal.        Judgment: Judgment normal.        Assessment and Plan        1. Back stiffness Concern for ankylosing spondylitis.  As stated in HPI has had some structural changes congenital and surgery.  But now he is complaining more about mid back discomfort with stiffness that occurs a lot in the morning.  And he has a lot of trouble throughout the day.  Will be getting x-rays to make sure nothing is changed in his spine.  And inflammatory markers to address possible arthritic and autoimmune concerns. Pending findings we will look at possible referrals to either orthopedic surgery, neurology, rheumatology.  Appreciate collaboration in his care in advance.  - DG Si Joints; Future -  DG Lumbar Spine Complete; Future - DG Thoracic Spine W/Swimmers; Future - C-reactive protein - Sed Rate (ESR) - Antinuclear Antib (ANA) - COMPLETE METABOLIC PANEL WITH GFR  2. Mid back pain, chronic As stated in #1.  Concern for ankylosing spondylitis.  Mid back pain and stiffness is a common complaint of this.  No history of autoimmune that he knows of.  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  - DG Si Joints; Future - DG Lumbar Spine Complete; Future - DG Thoracic Spine W/Swimmers; Future  3. Fusion of lumbosacral spine Status post fusion back in 2014.  Reports that he sometimes has discomfort in this area.  Possible concern for ankylosing spondylitis.  Will be getting x-rays looking at possible changes in his bone structure.  Pending changes we will look at possible need for referrals including orthopedic surgeon, neurology, rheumatology  - DG Si Joints; Future - DG Lumbar Spine Complete; Future  4. Spondylolysis, lumbar region See #1, #2, #3  - DG Si Joints; Future - DG Lumbar Spine Complete; Future - DG Thoracic Spine W/Swimmers; Future   Follow Up: 1 month   Perlie Mayo, DNP, AGNP-BC [address], [address] [place], [place] [address] Office Hours: Mon-Thurs 8 am-5 pm; Fri 8 am-12 pm Office Phone:  [phone]  Office Fax: [phone]

## 2019-06-14 NOTE — Patient Instructions (Signed)
  I appreciate the opportunity to provide you with the care for your health and wellness. Today we discussed: mid back pain  Follow up: 1 month -can be by phone   Labs today down the hall.  Xrays at [facility] today.  Pending results we will see what we can do and possible referrals needed.  Please continue to practice social distancing to keep you, your family, and our community safe.  If you must go out, please wear a mask and practice good handwashing.  It was a pleasure to see you and I look forward to continuing to work together on your health and well-being. Please do not hesitate to call the office if you need care or have questions about your care.  Have a wonderful day and week. With Gratitude, Cherly Beach, DNP, AGNP-BC

## 2019-06-15 ENCOUNTER — Ambulatory Visit
Admission: EM | Admit: 2019-06-15 | Discharge: 2019-06-15 | Disposition: A | Discharge status: Home / Self Care | Payer: BC Managed Care – PPO | Attending: Emergency Medicine | Admitting: Emergency Medicine

## 2019-06-15 ENCOUNTER — Other Ambulatory Visit: Payer: Self-pay

## 2019-06-15 DIAGNOSIS — Z20828 Contact with and (suspected) exposure to other viral communicable diseases: Secondary | ICD-10-CM | POA: Diagnosis not present

## 2019-06-15 DIAGNOSIS — Z20822 Contact with and (suspected) exposure to covid-19: Secondary | ICD-10-CM

## 2019-06-15 LAB — COMPLETE METABOLIC PANEL WITH GFR
AG Ratio: 2.5 (calc) (ref 1.0–2.5)
ALT: 13 U/L (ref 9–46)
AST: 16 U/L (ref 10–40)
Albumin: 4.8 g/dL (ref 3.6–5.1)
Alkaline phosphatase (APISO): 57 U/L (ref 36–130)
BUN: 16 mg/dL (ref 7–25)
CO2: 29 mmol/L (ref 20–32)
Calcium: 9.8 mg/dL (ref 8.6–10.3)
Chloride: 105 mmol/L (ref 98–110)
Creat: 0.86 mg/dL (ref 0.60–1.35)
GFR, Est African American: 136 mL/min/{1.73_m2} (ref >=60)
GFR, Est Non African American: 117 mL/min/{1.73_m2} (ref >=60)
Globulin: 1.9 g/dL (calc) (ref 1.9–3.7)
Glucose, Bld: 88 mg/dL (ref 65–99)
Potassium: 4.2 mmol/L (ref 3.5–5.3)
Sodium: 141 mmol/L (ref 135–146)
Total Bilirubin: 0.4 mg/dL (ref 0.2–1.2)
Total Protein: 6.7 g/dL (ref 6.1–8.1)

## 2019-06-15 LAB — ANA: Anti Nuclear Antibody (ANA): NEGATIVE

## 2019-06-15 LAB — SEDIMENTATION RATE: Sed Rate: 2 mm/h (ref 0–15)

## 2019-06-15 LAB — C-REACTIVE PROTEIN: CRP: 0.9 mg/L (ref <8.0)

## 2019-06-15 NOTE — Discharge Instructions (Signed)
COVID testing ordered.  It will take between 5-7 days for test results.  Someone will contact you regarding abnormal results.    In the meantime: You should remain isolated in your home for 10 days from symptom onset AND greater than 72 hours after symptoms resolution (absence of fever without the use of fever-reducing medication and improvement in respiratory symptoms), whichever is longer Get plenty of rest and push fluids Use OTC zyrtec as needed for nasal congestion, runny nose, and/or sore throat Use OTC flonase as needed for nasal congestion and runny nose Use OTC medications like ibuprofen or tylenol as needed fever or pain Call or go to the ED if you have any new or worsening symptoms such as fever, worsening cough, shortness of breath, chest tightness, chest pain, turning blue, changes in mental status, etc..Marland Kitchen

## 2019-06-15 NOTE — ED Triage Notes (Signed)
Pt having mild symptoms, pts mom tested positive this week

## 2019-06-15 NOTE — ED Provider Notes (Signed)
[facility]   [id number] 06/15/19 Arrival Time: 1445   CC: COVID exposure  SUBJECTIVE: History from: patient.  Matthew Andrews is a 28 y.o. male who presents for COVID testing.  Admits to COVID exposure to mother 4 days ago.  Denies aggravating or alleviating symptoms.  Denies previous COVID infection.   Denies fever, chills, fatigue, nasal congestion, rhinorrhea, sore throat, cough, SOB, wheezing, chest pain, nausea, vomiting, changes in bowel or bladder habits.    ROS: As per HPI.  All other pertinent ROS negative.     Past Medical History:  Diagnosis Date  . Anxiety    Past Surgical History:  Procedure Laterality Date  . knee surgery Left   . LUMBAR SPINE SURGERY     2014   No Known Allergies No current facility-administered medications on file prior to encounter.    Current Outpatient Medications on File Prior to Encounter  Medication Sig Dispense Refill  . pantoprazole (PROTONIX) 40 MG tablet Take 1 tablet (40 mg total) by mouth daily. 30 tablet 3   Social History   Socioeconomic History  . Marital status: Married    Spouse name: Khriz Liddy   . Number of children: 2  . Years of education: Not on file  . Highest education level: High school graduate  Occupational History  . Occupation: Chief Financial Officer    Comment: tree Dealer   Social Needs  . Financial resource strain: Not hard at all  . Food insecurity    Worry: Never true    Inability: Never true  . Transportation needs    Medical: No    Non-medical: No  Tobacco Use  . Smoking status: Never Smoker  . Smokeless tobacco: Never Used  Substance and Sexual Activity  . Alcohol use: Yes  . Drug use: Never  . Sexual activity: Yes  Lifestyle  . Physical activity    Days per week: 5 days    Minutes per session: 30 min  . Stress: To some extent  Relationships  . Social connections    Talks on phone: More than three times a week    Gets together: More than three times a week    Attends  religious service: More than 4 times per year    Active member of club or organization: No    Attends meetings of clubs or organizations: Never    Relationship status: Married  . Intimate partner violence    Fear of current or ex partner: No    Emotionally abused: No    Physically abused: No    Forced sexual activity: No  Other Topics Concern  . Not on file  Social History Narrative   Lives with wife [name] married a year in half, dated for 5 years before    2 children   Daughter 4- Faith    Son 2- Engineer, maintenance       Pets: 2 dogs: American Bully: Cleo   Mutt: Dotson Mix: Pluto       Enjoys: watching football, outside, family and friends      Diet: junk food-chips, red meat, chicken, veggies and fruits, salads, was eating a lot of spicy foods, but trying to reduced   Water: 1 bottle a day   Caffiene: tea 2-3       Wears seat belt    Wears sun protection   Smoke detectors    Does not use phone while driving   Family History  Problem Relation Age of Onset  . Cancer Father  OBJECTIVE:  Vitals:   06/15/19 1502  BP: 126/84  Pulse: 82  Resp: 16  Temp: 97.9 F (36.6 C)  TempSrc: Oral  SpO2: 99%     General appearance: alert; well-appearing, nontoxic; speaking in full sentences and tolerating own secretions HEENT: NCAT; Ears: EACs clear, TMs pearly gray; Eyes: PERRL.  EOM grossly intact. Nose: nares patent without rhinorrhea, Throat: oropharynx clear, tonsils non erythematous or enlarged, uvula midline  Neck: supple without LAD Lungs: unlabored respirations, symmetrical air entry; cough: absent; no respiratory distress; CTAB Heart: regular rate and rhythm.  Radial pulses 2+ symmetrical bilaterally Skin: warm and dry Psychological: alert and cooperative; normal mood and affect  ASSESSMENT & PLAN:  1. Close exposure to COVID-19 virus   2. Suspected COVID-19 virus infection    COVID testing ordered.  It will take between 5-7 days for test results.  Someone will contact  you regarding abnormal results.    In the meantime: You should remain isolated in your home for 10 days from symptom onset AND greater than 72 hours after symptoms resolution (absence of fever without the use of fever-reducing medication and improvement in respiratory symptoms), whichever is longer Get plenty of rest and push fluids Use OTC zyrtec as needed for nasal congestion, runny nose, and/or sore throat Use OTC flonase as needed for nasal congestion and runny nose Use OTC medications like ibuprofen or tylenol as needed fever or pain Call or go to the ED if you have any new or worsening symptoms such as fever, worsening cough, shortness of breath, chest tightness, chest pain, turning blue, changes in mental status, etc...   Reviewed expectations re: course of current medical issues. Questions answered. Outlined signs and symptoms indicating need for more acute intervention. Patient verbalized understanding. After Visit Summary given.         Lestine Box, PA-C 06/15/19 1620

## 2019-06-15 NOTE — Progress Notes (Signed)
Overall labs are great, no inflammation noted. Kidney and Liver are still good as well. Still awaiting the ANA level, though I suspect it might be normal range as well. Please ask if he would like PT to help.

## 2019-06-16 ENCOUNTER — Telehealth: Payer: Self-pay | Admitting: *Deleted

## 2019-06-16 NOTE — Telephone Encounter (Signed)
[facility]. Would like a call when results come in as he is quarintining until they come back and then wants to know next steps.

## 2019-06-16 NOTE — Progress Notes (Signed)
ANA is negative.

## 2019-06-19 ENCOUNTER — Telehealth: Payer: Self-pay

## 2019-06-19 LAB — NOVEL CORONAVIRUS, NAA: SARS-CoV-2, NAA: NOT DETECTED

## 2019-06-19 NOTE — Telephone Encounter (Signed)
Pt is returning your call

## 2019-06-19 NOTE — Telephone Encounter (Signed)
Tried returning patients call to discuss lab results. Patients vm has not been set up. Will try again later.

## 2019-06-19 NOTE — Telephone Encounter (Signed)
Advised patient Covid test was negative.

## 2019-06-19 NOTE — Telephone Encounter (Signed)
Patient aware of results of all labs.

## 2019-06-22 ENCOUNTER — Ambulatory Visit: Payer: BC Managed Care – PPO | Admitting: Family Medicine

## 2019-07-18 ENCOUNTER — Ambulatory Visit: Payer: BC Managed Care – PPO | Admitting: Family Medicine

## 2019-07-18 ENCOUNTER — Other Ambulatory Visit: Payer: Self-pay

## 2019-07-24 ENCOUNTER — Other Ambulatory Visit: Payer: Self-pay

## 2019-07-24 ENCOUNTER — Ambulatory Visit (INDEPENDENT_AMBULATORY_CARE_PROVIDER_SITE_OTHER): Payer: BC Managed Care – PPO | Admitting: Family Medicine

## 2019-07-24 ENCOUNTER — Encounter: Payer: Self-pay | Admitting: Family Medicine

## 2019-07-24 DIAGNOSIS — M4306 Spondylolysis, lumbar region: Secondary | ICD-10-CM | POA: Diagnosis not present

## 2019-07-24 DIAGNOSIS — R1010 Upper abdominal pain, unspecified: Secondary | ICD-10-CM

## 2019-07-24 DIAGNOSIS — K219 Gastro-esophageal reflux disease without esophagitis: Secondary | ICD-10-CM | POA: Diagnosis not present

## 2019-07-24 NOTE — Patient Instructions (Signed)
I appreciate the opportunity to provide you with care for your health and wellness. Today we discussed: stomach pain, desire for referral to GI  Follow up: 7 months for annual and as needed  No labs  Referral to GI as discussed.  Please take protonix daily. I think you might have an ulcer, but GI will help you with the treatment and make sure it is that versus something else.  Continue to avoid alcohol, caffeine, spicy and acidic foods. I attached some information about ulcers in the stomach.   I hope you have a wonderful, happy, safe, and healthy Holiday Season! See you in the New Year :)  Please continue to practice social distancing to keep you, your family, and our community safe.  If you must go out, please wear a mask and practice good handwashing.  It was a pleasure to see you and I look forward to continuing to work together on your health and well-being. Please do not hesitate to call the office if you need care or have questions about your care.  Have a wonderful day and week. With Gratitude, Tereasa Coop, DNP, AGNP-BC   Peptic Ulcer Eating Plan Peptic ulcers are sores that form on the lining of the stomach, esophagus, or the part of the small intestine that is attached to the stomach (duodenum). These sores are also called stomach ulcers. When ulcers develop, they can cause a burning feeling in the stomach as well as bloating, nausea, vomiting, and poor appetite. If you have a history of peptic ulcers, it is important to keep track of what foods and drinks cause symptoms. What are tips for following this plan?   Eat a healthy, well-balanced diet. This includes: ? Fresh fruits and vegetables. Eat a variety of colors of fruits and vegetables. ? Whole grains. Try to make sure at least half of the grains you eat each day are whole grains. ? Low-fat dairy. ? Lean meat, fish, poultry, eggs, beans, and nuts. ? Healthy fats, such as olive oil, grapeseed oil, or canola  oil. Try to eat less than 8 teaspoons of fats and oils each day.  Avoid foods that cause irritation or pain. These may be different for different people. Keep a food diary to identify foods that cause symptoms.  Avoid processed foods that have added salt and sugar.  Avoid drinking alcohol.  Avoid drinks with caffeine, such as cola, black tea, energy drinks, and coffee. Recommended foods Grains  Whole grains. Vegetables  All fresh or frozen vegetables. Low-sodium canned vegetables. Fruits  All fresh, frozen, or dried fruit. Fruit canned in juice. Meats and other protein foods  Lean cuts of meat. Skinless poultry. Fresh or canned fish. Eggs. Tofu. Nuts and nut butter. Dried beans. Low-sodium canned beans. Dairy  Low-fat or nonfat (skim) milk. Nonfat or low-fat yogurt. Nonfat or low-fat cheese. Beverages  Water. Soy or nut milks. Caffeine-free soft drinks. Herbal tea. Fats and oils  Olive oil. Canola oil. Grapeseed oil. Sunflower oil. Seasoning and other foods  Low-fat salad dressing. Ketchup. Low-fat mayonnaise. All spices except pepper. Low-sodium seasoning mixes. Foods to avoid Meats and other protein foods  Fatty meats. Fried meats. Any meat that causes symptoms. Dairy  Whole milk. Ice cream. Cream. Chocolate milk. Beverages  Alcohol. Coffee. Cola and energy drinks. Black or green tea. Cocoa. Fats and oils  Butter. Lard. Ghee. Seasoning and other foods  Pepper. Hot sauce. Any seasonings or condiments that cause symptoms. Summary  Peptic ulcers can cause burning in  the stomach as well as bloating, nausea, vomiting, and poor appetite. You may be able to limit symptoms by avoiding foods that make you feel worse.  Work with your dietitian or health care provider to identify foods that cause symptoms. This may include caffeinated drinks, alcohol, or pepper. This information is not intended to replace advice given to you by your health care provider. Make sure you  discuss any questions you have with your health care provider. Document Released: 10/26/2011 Document Revised: 07/16/2017 Document Reviewed: 09/14/2016 Elsevier Patient Education  2020 [employer].

## 2019-07-24 NOTE — Progress Notes (Signed)
Virtual Visit via Telephone Note   This visit type was conducted due to national recommendations for restrictions regarding the COVID-19 Pandemic (e.g. social distancing) in an effort to limit this patient's exposure and mitigate transmission in our community.  Due to his co-morbid illnesses, this patient is at least at moderate risk for complications without adequate follow up.  This format is felt to be most appropriate for this patient at this time.  The patient did not have access to video technology/had technical difficulties with video requiring transitioning to audio format only (telephone).  All issues noted in this document were discussed and addressed.  No physical exam could be performed with this format.   Evaluation Performed:  Follow-up visit  Date:  07/26/2019   ID:  Matthew Andrews, DOB 06-Jun-1991, MRN [id number]  Patient Location: Home Provider Location: Office  Location of Patient: Home Location of Provider: Telehealth Consent was obtain for visit to be over via telehealth. I verified that I am speaking with the correct person using two identifiers.  PCP:  Freddy Finner, NP   Chief Complaint:  Back pain and GERD   History of Present Illness:    Matthew Andrews is a 28 y.o. male with history of anxiety, back pain with surgery, GERD. Presents today for follow-up regarding back pain which he reports is getting better. Additionally however he reports that his acid reflux has not helped.  And he finished taking his Protonix reports that it did help some but he is not been taking it did not have it refilled.  Also referral to specialist. Reports that he is not been drinking alcohol and has been working on his diet.  Reports that he has some discomfort with leaning over something like a stool at work. Stomach pains were better when taking the Protonix.  Today he denies having any nausea or vomiting.  No changes in his bowel or bladder.  The patient does not have symptoms  concerning for COVID-19 infection (fever, chills, cough, or new shortness of breath).   Past Medical, Surgical, Social History, Allergies, and Medications have been Reviewed. Past Medical History:  Diagnosis Date  . Anxiety    Past Surgical History:  Procedure Laterality Date  . knee surgery Left   . LUMBAR SPINE SURGERY     2014     No outpatient medications have been marked as taking for the 07/24/19 encounter (Office Visit) with Freddy Finner, NP.     Allergies:   Patient has no known allergies.   Social History   Tobacco Use  . Smoking status: Never Smoker  . Smokeless tobacco: Never Used  Substance Use Topics  . Alcohol use: Yes  . Drug use: Never     Family Hx: The patient's family history includes Cancer in his father.  ROS:   Please see the history of present illness.    All other systems reviewed and are negative.   Labs/Other Tests and Data Reviewed:    Recent Labs: 05/24/2019: Hemoglobin 14.4; Platelets 285 06/14/2019: ALT 13; BUN 16; Creat 0.86; Potassium 4.2; Sodium 141   Recent Lipid Panel Lab Results  Component Value Date/Time   CHOL 191 05/24/2019 04:20 PM   TRIG 353 (H) 05/24/2019 04:20 PM   HDL 39 (L) 05/24/2019 04:20 PM   CHOLHDL 4.9 05/24/2019 04:20 PM   LDLCALC 105 (H) 05/24/2019 04:20 PM    Wt Readings from Last 3 Encounters:  06/14/19 179 lb 1.3 oz (81.2 kg)  05/24/19  172 lb 0.6 oz (78 kg)     Objective:    Vital Signs:  There were no vitals taken for this visit.  GEN:  Alert and oriented RESPIRATORY:  No shortness of breath noted in conversation PSYCH:  Normal affect and mood   ASSESSMENT & PLAN:    1. Gastroesophageal reflux disease, unspecified whether esophagitis present Declines wanting to have an increase in medication at this time.  Advised for him to take the Protonix as ordered.  Wants referral to GI provided with referral today to GI.  Further review for GERD diet provided as well.  I appreciate collaboration in  patient's plan of care. Please let PCP know if assistance from [place] is needed.   - Ambulatory referral to Gastroenterology  2. Pain of upper abdomen Liver function tests were good.  Kidney function tests were good.  Did not appear to have any outstanding labs when checked back in October, outside of an elevation in triglycerides.  Was provided with education on treatment for this. Encouraged to reduce alcohol consumption maintain GERD diet and restart Protonix referral made.  - Ambulatory referral to Gastroenterology  3. Spondylolysis, lumbar region Improved back pain.    Time:   Today, I have spent 10 minutes with the patient with telehealth technology discussing the above problems.     Medication Adjustments/Labs and Tests Ordered: Current medicines are reviewed at length with the patient today.  Concerns regarding medicines are outlined above.   Tests Ordered: Orders Placed This Encounter  Procedures  . Ambulatory referral to Gastroenterology    Medication Changes: No orders of the defined types were placed in this encounter.   Disposition:  Follow up 7 months annual or as needed  Signed, Perlie Mayo, NP  07/26/2019 12:42 PM     [facility]

## 2019-07-26 ENCOUNTER — Encounter (INDEPENDENT_AMBULATORY_CARE_PROVIDER_SITE_OTHER): Payer: Self-pay | Admitting: Gastroenterology

## 2019-07-26 ENCOUNTER — Encounter: Payer: Self-pay | Admitting: Family Medicine

## 2019-08-03 ENCOUNTER — Other Ambulatory Visit (INDEPENDENT_AMBULATORY_CARE_PROVIDER_SITE_OTHER): Payer: Self-pay | Admitting: *Deleted

## 2019-08-03 ENCOUNTER — Encounter (INDEPENDENT_AMBULATORY_CARE_PROVIDER_SITE_OTHER): Payer: Self-pay | Admitting: Nurse Practitioner

## 2019-08-03 ENCOUNTER — Other Ambulatory Visit: Payer: Self-pay

## 2019-08-03 ENCOUNTER — Encounter (INDEPENDENT_AMBULATORY_CARE_PROVIDER_SITE_OTHER): Payer: Self-pay | Admitting: *Deleted

## 2019-08-03 ENCOUNTER — Ambulatory Visit (INDEPENDENT_AMBULATORY_CARE_PROVIDER_SITE_OTHER): Payer: BC Managed Care – PPO | Admitting: Nurse Practitioner

## 2019-08-03 VITALS — BP 116/79 | HR 71 | Temp 97.9°F | Ht 72.0 in | Wt 181.5 lb

## 2019-08-03 DIAGNOSIS — K219 Gastro-esophageal reflux disease without esophagitis: Secondary | ICD-10-CM | POA: Diagnosis not present

## 2019-08-03 DIAGNOSIS — R1011 Right upper quadrant pain: Secondary | ICD-10-CM | POA: Diagnosis not present

## 2019-08-03 HISTORY — DX: Right upper quadrant pain: R10.11

## 2019-08-03 MED ORDER — HYOSCYAMINE SULFATE 0.125 MG SL SUBL: 0.1250 mg | SUBLINGUAL_TABLET | Freq: Four times a day (QID) | SUBLINGUAL | 0 refills | Status: DC | PRN

## 2019-08-03 NOTE — Progress Notes (Signed)
Patient profile: Matthew Andrews is a 28 y.o. male seen for evaluation of GERD. Referred by Tereasa Coop, NP. PMHX of back pain and GERD.   History of Present Illness: Matthew Andrews is seen today for GERD-reports GERD symptoms initially began about a year ago.  These have worsened and he was started on Protonix 40 mg once a day in October 2020.  He reports the Protonix has helped the regurgitation symptoms as well as some diarrhea he previously had.  He does have return of GERD symptoms with missed doses of PPI.  Reports at this point he is compliant with PPI and only has mild reflux symptoms if eats acidic or spicy foods.   He does continue to have right upper quadrant pain and does not feel the Protonix has helped right upper quadrant pain.  At this point pain seems to be occurring more frequent compared to 6 months ago.  It is now occurring daily and describes a sharp stabbing pain, can occur either after meals or be triggered by bending. Usually last 10 to 30 minutes. Does not seem to be clearly postprandial.  Will radiate to his right back at times but not always. Has occasional nausea but no vomiting with the pain. Has not found any clear food triggers. No dysphagia.  Initially had frequent diarrhea but since trying Protonix this actually seems resolved.  He is now having diarrhea 2-3 times a week that consist of one loose stool.  Other days his stools are normal consistency.  Remote history 6 months ago of some dark stools after using Goody's and BCs but he has stopped these and has not noted further dark stools.  He is now using mainly Tylenol with NSAIDs less than once a week.  Drinks alcohol once a month.  No tobacco.  He denies any lower abdominal pain.    Wt Readings from Last 3 Encounters:  08/03/19 181 lb 8 oz (82.3 kg)  06/14/19 179 lb 1.3 oz (81.2 kg)  05/24/19 172 lb 0.6 oz (78 kg)     Last Colonoscopy: none  Last Endoscopy: none    Past Medical History:  Past Medical  History:  Diagnosis Date  . Anxiety     Problem List: Patient Active Problem List   Diagnosis Date Noted  . RUQ pain 08/03/2019  . Gastroesophageal reflux disease 06/08/2019  . Cervical spondylosis without myelopathy 04/26/2017  . Spondylolysis, lumbar region 04/26/2017    Past Surgical History: Past Surgical History:  Procedure Laterality Date  . knee surgery Left   . LUMBAR SPINE SURGERY     2014    Allergies: No Known Allergies    Home Medications:  Current Outpatient Medications:  .  pantoprazole (PROTONIX) 40 MG tablet, Take 1 tablet (40 mg total) by mouth daily., Disp: 30 tablet, Rfl: 3 .  hyoscyamine (LEVSIN SL) 0.125 MG SL tablet, Place 1 tablet (0.125 mg total) under the tongue every 6 (six) hours as needed for cramping (abd pain)., Disp: 30 tablet, Rfl: 0   Family History:  Reports father passed from melanoma at age 79.  Maternal aunt with gallbladder issues, maternal grandmother with unknown "GI issues", limited knowledge about father's medical history.  Social History:   reports that he has never smoked. He has never used smokeless tobacco. He reports current alcohol use. He reports that he does not use drugs.   Review of Systems: Constitutional: Denies weight loss/weight gain  Eyes: No changes in vision. ENT: No oral lesions, sore throat.  GI: see HPI.  Heme/Lymph: No easy bruising.  CV: No chest pain.  GU: No hematuria.  Integumentary: No rashes.  Neuro: No headaches.  Psych: No depression/anxiety.  Endocrine: No heat/cold intolerance.  Allergic/Immunologic: No urticaria.  Resp: No cough, SOB.  Musculoskeletal: + back pain     Physical Examination: BP 116/79 (BP Location: Right Arm, Patient Position: Sitting, Cuff Size: Large)   Pulse 71   Temp 97.9 F (36.6 C) (Oral)   Ht 6' (1.829 m)   Wt 181 lb 8 oz (82.3 kg)   BMI 24.62 kg/m  Gen: NAD, alert and oriented x 4 HEENT: PEERLA, EOMI, Neck: supple, no JVD Chest: CTA bilaterally, no wheezes,  crackles, or other adventitious sounds CV: RRR, no m/g/c/r Abd: soft, NT, ND, +BS in all four quadrants; no HSM, guarding, ridigity, or rebound tenderness Ext: no edema, well perfused with 2+ pulses, Skin: no rash or lesions noted on observed skin Lymph: no noted LAD  Data: Lab Results  Component Value Date   WBC 5.1 05/24/2019   HGB 14.4 05/24/2019   HCT 42.3 05/24/2019   MCV 92.0 05/24/2019   PLT 285 05/24/2019   No results for input(s): HGB in the last 168 hours. Lab Results  Component Value Date   NA 141 06/14/2019   K 4.2 06/14/2019   CL 105 06/14/2019   CO2 29 06/14/2019   BUN 16 06/14/2019   CREATININE 0.86 06/14/2019   Lab Results  Component Value Date   ALT 13 06/14/2019   AST 16 06/14/2019   BILITOT 0.4 06/14/2019   No results for input(s): APTT, INR, PTT in the last 168 hours.   Assessment/Plan: Mr. Kalas is a 28 y.o. male    Matthew Andrews was seen today for new patient (initial visit).  Diagnoses and all orders for this visit:  Gastroesophageal reflux disease, unspecified whether esophagitis present -     Procedural/ Surgical Case Request: ESOPHAGOGASTRODUODENOSCOPY (EGD); Standing -     Procedural/ Surgical Case Request: ESOPHAGOGASTRODUODENOSCOPY (EGD)  RUQ pain -     Lipase -     US Abdomen Limited RUQ; Future -     Hepatic function panel -     Procedural/ Surgical Case Request: ESOPHAGOGASTRODUODENOSCOPY (EGD); Standing -     Procedural/ Surgical Case Request: ESOPHAGOGASTRODUODENOSCOPY (EGD)  Other orders -     hyoscyamine (LEVSIN SL) 0.125 MG SL tablet; Place 1 tablet (0.125 mg total) under the tongue every 6 (six) hours as needed for cramping (abd pain).    1. GERD-symptoms improved with Protonix 40 mg once a day.  We did review some diet modifications including avoiding acidic foods, we also discussed decreasing daily monster energy drinks. NSAID avoidance reviewed  2.  Right upper quadrant pain-episodic, occurring most days, CBC and CMP  unremarkable Oct 2020, feels symptoms slightly worse at this point and will repeat HFP and lipase. Plan for RUQ [place]. If negative [place], endoscopy is scheduled and can bx for h pylori at that time. He can try levsin prn for episodes.   3.  Diarrhea-minimal and episodic, reviewed may be diet related. Overall seems better with Protonix.  Patient denies CP, SOB, and use of blood thinners. I discussed the risks and benefits of procedure including bleeding, perforation, infection, missed lesions, medication reactions and possible hospitalization or surgery if complications. All questions answered.    I personally performed the service, non-incident to. (WP)  Laurine Blazer, [facility]  I agree with the above assessment and  planl Best BuyColleen Kennedy-Smith CRNP

## 2019-08-03 NOTE — Patient Instructions (Signed)
We are scheduling a right upper quadrant ultrasound for evaluation abdominal pain.  Continue Protonix 40 mg once a day 30 minutes before breakfast.  I sent Levsin to pharmacy to use as needed for abdominal pain.  We are also scheduling an endoscopy for evaluation.  I will contact you with your lab results.  Please contact us with any questions in interim.  Diet modifications as we discussed including decreasing energy drink intake, spicy greasy foods.  Avoid NSAID products.

## 2019-08-04 LAB — HEPATIC FUNCTION PANEL
AG Ratio: 2.1 (calc) (ref 1.0–2.5)
ALT: 16 U/L (ref 9–46)
AST: 18 U/L (ref 10–40)
Albumin: 4.8 g/dL (ref 3.6–5.1)
Alkaline phosphatase (APISO): 72 U/L (ref 36–130)
Bilirubin, Direct: 0.1 mg/dL (ref 0.0–0.2)
Globulin: 2.3 g/dL (calc) (ref 1.9–3.7)
Indirect Bilirubin: 0.1 mg/dL (calc) — ABNORMAL LOW (ref 0.2–1.2)
Total Bilirubin: 0.2 mg/dL (ref 0.2–1.2)
Total Protein: 7.1 g/dL (ref 6.1–8.1)

## 2019-08-04 LAB — LIPASE: Lipase: 18 U/L (ref 7–60)

## 2019-08-08 ENCOUNTER — Ambulatory Visit (HOSPITAL_COMMUNITY): Payer: BC Managed Care – PPO

## 2019-08-10 ENCOUNTER — Ambulatory Visit
Admission: EM | Admit: 2019-08-10 | Discharge: 2019-08-10 | Disposition: A | Discharge status: Home / Self Care | Payer: BC Managed Care – PPO | Attending: Emergency Medicine | Admitting: Emergency Medicine

## 2019-08-10 DIAGNOSIS — Z20828 Contact with and (suspected) exposure to other viral communicable diseases: Secondary | ICD-10-CM | POA: Diagnosis not present

## 2019-08-10 DIAGNOSIS — Z20822 Contact with and (suspected) exposure to covid-19: Secondary | ICD-10-CM

## 2019-08-10 NOTE — ED Triage Notes (Signed)
Pt here for covid testing after positive exposure  

## 2019-08-10 NOTE — Discharge Instructions (Addendum)

## 2019-08-10 NOTE — ED Provider Notes (Signed)
[facility]    CSN: [id number] Arrival date & time: 08/10/19  1352      History   Chief Complaint No chief complaint on file.   HPI Matthew Andrews is a 28 y.o. male.   Matthew Andrews 28 years old male presented to the urgent care with a complaint of Covid exposure for the past 5 days.  Denies sick exposure  flu or strep.  Denies recent travel.  Denies aggravating or alleviating symptoms.  Denies previous COVID infection.   Denies fever, chills, fatigue, nasal congestion, rhinorrhea, sore throat, cough, SOB, wheezing, chest pain, nausea, vomiting, changes in bowel or bladder habits.    The history is provided by the patient. A language interpreter was used.    Past Medical History:  Diagnosis Date  . Anxiety     Patient Active Problem List   Diagnosis Date Noted  . RUQ pain 08/03/2019  . Gastroesophageal reflux disease 06/08/2019  . Cervical spondylosis without myelopathy 04/26/2017  . 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 knee surgery Left   . LUMBAR SPINE SURGERY     2014       Home Medications    Prior to Admission medications   Medication Sig Start Date End Date Taking? Authorizing Provider  hyoscyamine (LEVSIN SL) 0.125 MG SL tablet Place 1 tablet (0.125 mg total) under the tongue every 6 (six) hours as needed for cramping (abd pain). 08/03/19   Minus Liberty, PA-C  pantoprazole (PROTONIX) 40 MG tablet Take 1 tablet (40 mg total) by mouth daily. 05/24/19   Perlie Mayo, NP    Family History Family History  Problem Relation Age of Onset  . Cancer Father     Social History Social History   Tobacco Use  . Smoking status: Never Smoker  . Smokeless tobacco: Never Used  Substance Use Topics  . Alcohol use: Yes  . Drug use: Never     Allergies   Patient has no known allergies.   Review of Systems Review of Systems  Constitutional: Negative.   HENT: Negative.     Respiratory: Negative.   Cardiovascular: Negative.   Gastrointestinal: Negative.   Neurological: Negative.      Physical Exam Triage Vital Signs ED Triage Vitals  Enc Vitals Group     BP      Pulse      Resp      Temp      Temp src      SpO2      Weight      Height      Head Circumference      Peak Flow      Pain Score      Pain Loc      Pain Edu?      Excl. in [place]?    No data found.  Updated Vital Signs BP 112/76 (BP Location: Right Arm)   Pulse 75   Temp 97.7 F (36.5 C) (Oral)   Resp 16   SpO2 98%   Visual Acuity Right Eye Distance:   Left Eye Distance:   Bilateral Distance:    Right Eye Near:   Left Eye Near:    Bilateral Near:     Physical Exam Vitals and nursing note reviewed.  Constitutional:      General: He is not in acute distress.    Appearance: Normal appearance. He is normal weight. He is not ill-appearing or toxic-appearing.  HENT:     Head: Normocephalic.     Right Ear: Tympanic membrane, ear canal and external ear normal. There is no impacted cerumen.     Left Ear: Tympanic membrane, ear canal and external ear normal. There is no impacted cerumen.     Nose: Nose normal. No congestion.     Mouth/Throat:     Mouth: Mucous membranes are moist.     Pharynx: No oropharyngeal exudate or posterior oropharyngeal erythema.  Cardiovascular:     Rate and Rhythm: Normal rate and regular rhythm.     Pulses: Normal pulses.     Heart sounds: Normal heart sounds. No murmur.  Pulmonary:     Effort: Pulmonary effort is normal. No respiratory distress.     Breath sounds: No wheezing or rhonchi.  Chest:     Chest wall: No tenderness.  Abdominal:     General: Abdomen is flat. Bowel sounds are normal. There is no distension.     Palpations: There is no mass.  Skin:    Capillary Refill: Capillary refill takes less than 2 seconds.  Neurological:     Mental Status: He is alert and oriented to person, place, and time.      UC Treatments / Results   Labs (all labs ordered are listed, but only abnormal results are displayed) Labs Reviewed  NOVEL CORONAVIRUS, NAA    EKG   Radiology No results found.  Procedures Procedures (including critical care time)  Medications Ordered in UC Medications - No data to display  Initial Impression / Assessment and Plan / UC Course  I have reviewed the triage vital signs and the nursing notes.  Pertinent labs & imaging results that were available during my care of the patient were reviewed by me and considered in my medical decision making (see chart for details).   Patient stable for discharge.  Benign physical exam.  Advised patient to quarantine until Covid result become available.  To return for worsening of symptoms.  Patient verbalized understanding of the plan of care.  Final Clinical Impressions(s) / UC Diagnoses   Final diagnoses:  Exposure to COVID-19 virus     Discharge Instructions     COVID testing ordered.  It will take between 2-7 days for test results.  Someone will contact you regarding abnormal results.    In the meantime: You should remain isolated in your home for 10 days from symptom onset  Get plenty of rest and push fluids Use medications daily for symptom relief Use OTC medications like ibuprofen or tylenol as needed fever or pain Call or go to the ED if you have any new or worsening symptoms such as fever, worsening cough, shortness of breath, chest tightness, chest pain, turning blue, changes in mental status, etc...     ED Prescriptions    None     PDMP not reviewed this encounter.   Durward Parcel, FNP 08/10/19 1455

## 2019-08-11 LAB — NOVEL CORONAVIRUS, NAA: SARS-CoV-2, NAA: NOT DETECTED

## 2019-08-15 ENCOUNTER — Other Ambulatory Visit: Payer: Self-pay

## 2019-08-15 ENCOUNTER — Ambulatory Visit (HOSPITAL_COMMUNITY)
Admission: RE | Admit: 2019-08-15 | Discharge: 2019-08-15 | Disposition: A | Discharge status: Home / Self Care | Payer: BC Managed Care – PPO | Source: Ambulatory Visit | Attending: Gastroenterology | Admitting: Gastroenterology

## 2019-08-15 DIAGNOSIS — R1011 Right upper quadrant pain: Secondary | ICD-10-CM | POA: Diagnosis not present

## 2019-08-23 ENCOUNTER — Other Ambulatory Visit: Payer: Self-pay

## 2019-08-23 ENCOUNTER — Encounter: Payer: Self-pay | Admitting: Emergency Medicine

## 2019-08-23 ENCOUNTER — Ambulatory Visit
Admission: EM | Admit: 2019-08-23 | Discharge: 2019-08-23 | Disposition: A | Discharge status: Home / Self Care | Payer: BC Managed Care – PPO | Attending: Emergency Medicine | Admitting: Emergency Medicine

## 2019-08-23 DIAGNOSIS — Z20822 Contact with and (suspected) exposure to covid-19: Secondary | ICD-10-CM | POA: Diagnosis not present

## 2019-08-23 NOTE — ED Triage Notes (Signed)
Sneezing and runny nose today  Wife works at a daycare where 2 kids have tested positive

## 2019-08-23 NOTE — ED Provider Notes (Signed)
[facility]    CSN: [id number] Arrival date & time: 08/23/19  1401      History   Chief Complaint Chief Complaint  Patient presents with  . Nasal Congestion    HPI Matthew Andrews is a 29 y.o. male.   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  Reported wife was exposed to children at daycare that tested positive for COVID-19.  Denies sick exposure to flu or strep.  Denies recent travel.  Denies aggravating or alleviating symptoms.  Denies previous COVID infection.   Denies fever, chills, fatigue, nasal congestion, rhinorrhea, sore throat, cough, SOB, wheezing, chest pain, nausea, vomiting, changes in bowel or bladder habits.       Past Medical History:  Diagnosis Date  . Anxiety     Patient Active Problem List   Diagnosis Date Noted  . RUQ pain 08/03/2019  . Gastroesophageal reflux disease 06/08/2019  . Cervical spondylosis without myelopathy 04/26/2017  . 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 knee surgery Left   . LUMBAR SPINE SURGERY     2014       Home Medications    Prior to Admission medications   Medication Sig Start Date End Date Taking? Authorizing Provider  pantoprazole (PROTONIX) 40 MG tablet Take 1 tablet (40 mg total) by mouth daily. 05/24/19  Yes Freddy Finner, NP  hyoscyamine (LEVSIN SL) 0.125 MG SL tablet Place 1 tablet (0.125 mg total) under the tongue every 6 (six) hours as needed for cramping (abd pain). 08/03/19   Levonne Hubert, PA-C    Family History Family History  Problem Relation Age of Onset  . Cancer Father     Social History Social History   Tobacco Use  . Smoking status: Never Smoker  . Smokeless tobacco: Never Used  Substance Use Topics  . Alcohol use: Yes  . Drug use: Never     Allergies   Patient has no known allergies.   Review of Systems Review of Systems  Constitutional:  Negative.   HENT: Negative.   Respiratory: Negative.   Cardiovascular: Negative.   Gastrointestinal: Negative.   Neurological: Negative.   ROS: All other negative   Physical Exam Triage Vital Signs ED Triage Vitals  Enc Vitals Group     BP 08/23/19 1431 107/67     Pulse Rate 08/23/19 1431 92     Resp 08/23/19 1431 18     Temp 08/23/19 1431 98 F (36.7 C)     Temp Source 08/23/19 1431 Oral     SpO2 08/23/19 1431 96 %     Weight --      Height --      Head Circumference --      Peak Flow --      Pain Score 08/23/19 1429 0     Pain Loc --      Pain Edu? --      Excl. in GC? --    No data found.  Updated Vital Signs BP 107/67 (BP Location: Right Arm)   Pulse 92   Temp 98 F (36.7 C) (Oral)   Resp 18   SpO2 96%   Visual Acuity Right Eye Distance:   Left Eye Distance:   Bilateral Distance:    Right Eye Near:   Left Eye Near:    Bilateral Near:     Physical Exam Vitals and nursing note  reviewed.  Constitutional:      General: He is not in acute distress.    Appearance: Normal appearance. He is normal weight. He is not ill-appearing or toxic-appearing.  HENT:     Head: Normocephalic.     Right Ear: Tympanic membrane, ear canal and external ear normal. There is no impacted cerumen.     Left Ear: Tympanic membrane, ear canal and external ear normal. There is no impacted cerumen.     Nose: Nose normal. No congestion.     Mouth/Throat:     Mouth: Mucous membranes are moist.     Pharynx: No oropharyngeal exudate or posterior oropharyngeal erythema.  Cardiovascular:     Rate and Rhythm: Normal rate and regular rhythm.     Pulses: Normal pulses.     Heart sounds: Normal heart sounds. No murmur.  Pulmonary:     Effort: Pulmonary effort is normal. No respiratory distress.     Breath sounds: No wheezing or rhonchi.  Chest:     Chest wall: No tenderness.  Abdominal:     General: Abdomen is flat. Bowel sounds are normal. There is no distension.     Palpations:  There is no mass.  Skin:    Capillary Refill: Capillary refill takes less than 2 seconds.  Neurological:     Mental Status: He is alert and oriented to person, place, and time.      UC Treatments / Results  Labs (all labs ordered are listed, but only abnormal results are displayed) Labs Reviewed  NOVEL CORONAVIRUS, NAA    EKG   Radiology No results found.  Procedures Procedures (including critical care time)  Medications Ordered in UC Medications - No data to display  Initial Impression / Assessment and Plan / UC Course  I have reviewed the triage vital signs and the nursing notes.  Pertinent labs & imaging results that were available during my care of the patient were reviewed by me and considered in my medical decision making (see chart for details).    COVID-19 test was ordered.  Patient is stable for discharge.  Advised patient to quarantine until COVID-19 test result become available.  To go to ED for worsening of symptoms.  Patient verbalized understanding of the plan of care. Final Clinical Impressions(s) / UC Diagnoses   Final diagnoses:  Close exposure to COVID-19 virus     Discharge Instructions     COVID testing ordered.  It will take between 2-7 days for test results.  Someone will contact you regarding abnormal results.    In the meantime: You should remain isolated in your home for 10 days from symptom onset AND greater than 72 hours after symptoms resolution (absence of fever without the use of fever-reducing medication and improvement in respiratory symptoms), whichever is longer Get plenty of rest and push fluids Use medications daily for symptom relief Use OTC medications like ibuprofen or tylenol as needed fever or pain Call or go to the ED if you have any new or worsening symptoms such as fever, worsening cough, shortness of breath, chest tightness, chest pain, turning blue, changes in mental status, etc...     ED Prescriptions    None      PDMP not reviewed this encounter.   Emerson Monte, FNP 08/23/19 1500

## 2019-08-23 NOTE — Discharge Instructions (Signed)

## 2019-08-25 LAB — NOVEL CORONAVIRUS, NAA: SARS-CoV-2, NAA: NOT DETECTED

## 2019-08-29 ENCOUNTER — Other Ambulatory Visit (HOSPITAL_COMMUNITY)
Admission: RE | Admit: 2019-08-29 | Discharge: 2019-08-29 | Disposition: A | Discharge status: Home / Self Care | Payer: BC Managed Care – PPO | Source: Ambulatory Visit | Attending: Internal Medicine | Admitting: Internal Medicine

## 2019-08-29 ENCOUNTER — Other Ambulatory Visit: Payer: Self-pay

## 2019-08-29 DIAGNOSIS — Z01812 Encounter for preprocedural laboratory examination: Secondary | ICD-10-CM | POA: Diagnosis not present

## 2019-08-29 DIAGNOSIS — Z20822 Contact with and (suspected) exposure to covid-19: Secondary | ICD-10-CM | POA: Diagnosis not present

## 2019-08-29 LAB — SARS CORONAVIRUS 2 (TAT 6-24 HRS): SARS Coronavirus 2: NEGATIVE

## 2019-08-31 ENCOUNTER — Encounter (HOSPITAL_COMMUNITY): Payer: Self-pay | Admitting: Internal Medicine

## 2019-08-31 ENCOUNTER — Ambulatory Visit (HOSPITAL_COMMUNITY)
Admission: RE | Admit: 2019-08-31 | Discharge: 2019-08-31 | Disposition: A | Discharge status: Home / Self Care | Payer: BC Managed Care – PPO | Attending: Internal Medicine | Admitting: Internal Medicine

## 2019-08-31 ENCOUNTER — Encounter (HOSPITAL_COMMUNITY)
Admission: RE | Disposition: A | Discharge status: Home / Self Care | Payer: Self-pay | Source: Home / Self Care | Attending: Internal Medicine

## 2019-08-31 ENCOUNTER — Other Ambulatory Visit: Payer: Self-pay

## 2019-08-31 DIAGNOSIS — R1011 Right upper quadrant pain: Secondary | ICD-10-CM | POA: Diagnosis not present

## 2019-08-31 DIAGNOSIS — R1013 Epigastric pain: Secondary | ICD-10-CM | POA: Diagnosis not present

## 2019-08-31 DIAGNOSIS — K319 Disease of stomach and duodenum, unspecified: Secondary | ICD-10-CM | POA: Diagnosis not present

## 2019-08-31 DIAGNOSIS — K219 Gastro-esophageal reflux disease without esophagitis: Secondary | ICD-10-CM | POA: Insufficient documentation

## 2019-08-31 DIAGNOSIS — K2970 Gastritis, unspecified, without bleeding: Secondary | ICD-10-CM | POA: Insufficient documentation

## 2019-08-31 DIAGNOSIS — K3189 Other diseases of stomach and duodenum: Secondary | ICD-10-CM | POA: Diagnosis not present

## 2019-08-31 HISTORY — PX: BIOPSY: SHX5522

## 2019-08-31 HISTORY — PX: ESOPHAGOGASTRODUODENOSCOPY: SHX5428

## 2019-08-31 SURGERY — EGD (ESOPHAGOGASTRODUODENOSCOPY)
Anesthesia: Moderate Sedation

## 2019-08-31 MED ORDER — LIDOCAINE VISCOUS HCL 2 % MT SOLN
OROMUCOSAL | Status: AC
  Filled 2019-08-31: qty 15

## 2019-08-31 MED ORDER — LIDOCAINE VISCOUS HCL 2 % MT SOLN
OROMUCOSAL | Status: DC | PRN
  Administered 2019-08-31: 1 via OROMUCOSAL

## 2019-08-31 MED ORDER — MIDAZOLAM HCL 5 MG/5ML IJ SOLN
INTRAMUSCULAR | Status: DC | PRN
  Administered 2019-08-31: 1 mg via INTRAVENOUS
  Administered 2019-08-31: 3 mg via INTRAVENOUS
  Administered 2019-08-31 (×3): 2 mg via INTRAVENOUS

## 2019-08-31 MED ORDER — SODIUM CHLORIDE 0.9 % IV SOLN: INTRAVENOUS | Status: DC

## 2019-08-31 MED ORDER — MEPERIDINE HCL 50 MG/ML IJ SOLN
INTRAMUSCULAR | Status: AC
  Filled 2019-08-31: qty 1

## 2019-08-31 MED ORDER — PROMETHAZINE HCL 25 MG/ML IJ SOLN
INTRAMUSCULAR | Status: AC
  Filled 2019-08-31: qty 1

## 2019-08-31 MED ORDER — DICYCLOMINE HCL 10 MG PO CAPS: 10.0000 mg | ORAL_CAPSULE | Freq: Three times a day (TID) | ORAL | 1 refills | Status: DC

## 2019-08-31 MED ORDER — PROMETHAZINE HCL 25 MG/ML IJ SOLN
INTRAMUSCULAR | Status: DC | PRN
  Administered 2019-08-31: 6.25 mg via INTRAVENOUS

## 2019-08-31 MED ORDER — MIDAZOLAM HCL 5 MG/5ML IJ SOLN
INTRAMUSCULAR | Status: AC
  Filled 2019-08-31: qty 10

## 2019-08-31 MED ORDER — STERILE WATER FOR IRRIGATION IR SOLN
Status: DC | PRN
  Administered 2019-08-31: 1.5 mL

## 2019-08-31 MED ORDER — MEPERIDINE HCL 50 MG/ML IJ SOLN
INTRAMUSCULAR | Status: DC | PRN
  Administered 2019-08-31 (×4): 25 mg via INTRAVENOUS

## 2019-08-31 NOTE — H&P (Signed)
Matthew Andrews is an 29 y.o. male.   Chief Complaint: Patient is here for diagnostic esophagogastroduodenoscopy HPI: Patient is 29 year old Caucasian male who presents with 1 year history of epigastric/right upper quadrant pain.  Pain was infrequent to begin with and now occurs almost daily.  She usually triggered with meals.  Pain is very localized in epigastrium to the right of midline.  He also has been having heartburn.  Since he has been on pantoprazole heartburns better controlled.  He is also having intermittent nonbloody diarrhea.  He states he has noted improvement in his diarrhea with pantoprazole.  He denies melena or rectal bleeding anorexia or weight loss.  He has not experienced nausea or vomiting.  He used to take BC/Goody powder by 2 a day but stopped doing that 1 year ago.  Recent blood work reveals normal CBC chemistry panel and serum lipase.  Ultrasound 2 weeks ago was negative for cholelithiasis. He does not smoke cigarettes and drinks alcohol occasionally. Family history is negative for celiac disease.  Past Medical History:  Diagnosis Date  . Anxiety     Past Surgical History:  Procedure Laterality Date  . knee surgery Left   . LUMBAR SPINE SURGERY     2014    Family History  Problem Relation Age of Onset  . Cancer Father    Social History:  reports that he has never smoked. He has never used smokeless tobacco. He reports current alcohol use. He reports that he does not use drugs.  Allergies: No Known Allergies  Medications Prior to Admission  Medication Sig Dispense Refill  . pantoprazole (PROTONIX) 40 MG tablet Take 1 tablet (40 mg total) by mouth daily. 30 tablet 3  . hyoscyamine (LEVSIN SL) 0.125 MG SL tablet Place 1 tablet (0.125 mg total) under the tongue every 6 (six) hours as needed for cramping (abd pain). (Patient not taking: Reported on 08/24/2019) 30 tablet 0    No results found for this or any previous visit (from the past 48 hour(s)). No results  found.  Review of Systems  Blood pressure 123/86, pulse 87, temperature 97.7 F (36.5 C), temperature source Oral, resp. rate 16, height 6' (1.829 m), SpO2 100 %. Physical Exam  Constitutional: He appears well-developed and well-nourished.  HENT:  Mouth/Throat: Oropharynx is clear and moist.  Eyes: Conjunctivae are normal.  Neck: No thyromegaly present.  Cardiovascular: Normal rate, regular rhythm and normal heart sounds.  No murmur heard. Respiratory: Effort normal and breath sounds normal.  GI:  Abdomen is symmetrical.  Soft with mild epigastric tenderness on deep palpation.  No organomegaly or masses.  Musculoskeletal:        General: No edema.  Lymphadenopathy:    He has no cervical adenopathy.  Neurological: He is alert.  Skin: Skin is warm and dry.     Assessment/Plan Epigastric/right upper quadrant abdominal pain. GERD symptoms well controlled with therapy. Ultrasound negative for cholelithiasis. Diagnostic esophagogastroduodenoscopy.  Lionel December, MD 08/31/2019, 9:08 AM

## 2019-08-31 NOTE — Discharge Instructions (Signed)
No aspirin or NSAIDs for 24 hours. Resume pantoprazole as before.  Take it 30 minutes before breakfast daily. Dicyclomine 10 mg by mouth 30 minutes before each meal.  Try to take at least 2 doses a day. Resume usual diet. No driving for 24 hours. Physician will call with biopsy results.   Upper Endoscopy, Adult, Care After This sheet gives you information about how to care for yourself after your procedure. Your health care provider may also give you more specific instructions. If you have problems or questions, contact your health care provider. What can I expect after the procedure? After the procedure, it is common to have:  A sore throat.  Mild stomach pain or discomfort.  Bloating.  Nausea. Follow these instructions at home:   Follow instructions from your health care provider about what to eat or drink after your procedure.  Return to your normal activities as told by your health care provider. Ask your health care provider what activities are safe for you.  Take over-the-counter and prescription medicines only as told by your health care provider.  Do not drive for 24 hours if you were given a sedative during your procedure.  Keep all follow-up visits as told by your health care provider. This is important. Contact a health care provider if you have:  A sore throat that lasts longer than one day.  Trouble swallowing. Get help right away if:  You vomit blood or your vomit looks like coffee grounds.  You have: ? A fever. ? Bloody, black, or tarry stools. ? A severe sore throat or you cannot swallow. ? Difficulty breathing. ? Severe pain in your chest or abdomen. Summary  After the procedure, it is common to have a sore throat, mild stomach discomfort, bloating, and nausea.  Do not drive for 24 hours if you were given a sedative during the procedure.  Follow instructions from your health care provider about what to eat or drink after your procedure.  Return  to your normal activities as told by your health care provider. This information is not intended to replace advice given to you by your health care provider. Make sure you discuss any questions you have with your health care provider. Document Revised: 01/25/2018 Document Reviewed: 01/03/2018 Elsevier Patient Education  2020 [employer].

## 2019-08-31 NOTE — Op Note (Signed)
[facility] Patient Name: Matthew Andrews Procedure Date: 08/31/2019 9:07 AM MRN: [id number] Date of Birth: 13-Sep-1990 Attending MD: Lionel December , MD CSN: [id number] Age: 29 Admit Type: Outpatient Procedure:                Upper GI endoscopy Indications:              Epigastric abdominal pain, Abdominal pain in the                            right upper quadrant Providers:                Lionel December, MD, Buel Ream. Thomasena Edis RN, RN, Edythe Clarity, Technician Referring MD:             Tereasa Coop, NP Medicines:                Lidocaine spray, Meperidine 100 mg IV, Midazolam 10                            mg IV, Promethazine 6.25 mg IV Complications:            No immediate complications. Estimated Blood Loss:     Estimated blood loss was minimal. Procedure:                Pre-Anesthesia Assessment:                           - Prior to the procedure, a History and Physical                            was performed, and patient medications and                            allergies were reviewed. The patient's tolerance of                            previous anesthesia was also reviewed. The risks                            and benefits of the procedure and the sedation                            options and risks were discussed with the patient.                            All questions were answered, and informed consent                            was obtained. Prior Anticoagulants: The patient has                            taken no previous anticoagulant or antiplatelet  agents. ASA Grade Assessment: I - A normal, healthy                            patient. After reviewing the risks and benefits,                            the patient was deemed in satisfactory condition to                            undergo the procedure.                           After obtaining informed consent, the endoscope was                            passed  under direct vision. Throughout the                            procedure, the patient's blood pressure, pulse, and                            oxygen saturations were monitored continuously. The                            GIF-H190 ([id number]) scope was introduced through the                            mouth, and advanced to the second part of duodenum.                            The upper GI endoscopy was accomplished without                            difficulty. The patient tolerated the procedure                            well. Scope In: 9:24:12 AM Scope Out: 9:32:12 AM Total Procedure Duration: 0 hours 8 minutes 0 seconds  Findings:      The examined esophagus was normal.      The Z-line was regular and was found 41 cm from the incisors.      Patchy mild inflammation characterized by congestion (edema), erythema       and granularity was found in the gastric antrum and in the prepyloric       region of the stomach. Biopsies were taken with a cold forceps for       histology. The pathology specimen was placed into Bottle Number 2.      The exam of the stomach was otherwise normal.      The duodenal bulb and second portion of the duodenum were normal.       Biopsies for histology were taken with a cold forceps for evaluation of       celiac disease. The pathology specimen was placed into Bottle Number 1. Impression:               - Normal esophagus.                           -  Z-line regular, 41 cm from the incisors.                           - Gastritis. Biopsied.                           - Normal duodenal bulb and second portion of the                            duodenum. Biopsied. Moderate Sedation:      Moderate (conscious) sedation was administered by the endoscopy nurse       and supervised by the endoscopist. The following parameters were       monitored: oxygen saturation, heart rate, blood pressure, CO2       capnography and response to care. Total physician intraservice time  was       19 minutes. Recommendation:           - Patient has a contact number available for                            emergencies. The signs and symptoms of potential                            delayed complications were discussed with the                            patient. Return to normal activities tomorrow.                            Written discharge instructions were provided to the                            patient.                           - Resume previous diet today.                           - Continue present medications.                           - No aspirin, ibuprofen, naproxen, or other                            non-steroidal anti-inflammatory drugs for 1 day.                           - Use Bentyl (dicyclomine) 10 mg PO TID 30 min AC.                           - Await pathology results. Procedure Code(s):        --- Professional ---                           [id number], Esophagogastroduodenoscopy, flexible,  transoral; with biopsy, single or multiple                           G0500, Moderate sedation services provided by the                            same physician or other qualified health care                            professional performing a gastrointestinal                            endoscopic service that sedation supports,                            requiring the presence of an independent trained                            observer to assist in the monitoring of the                            patient's level of consciousness and physiological                            status; initial 15 minutes of intra-service time;                            patient age 49 years or older (additional time may                            be reported with 30160, as appropriate) Diagnosis Code(s):        --- Professional ---                           K29.70, Gastritis, unspecified, without bleeding                           R10.13, Epigastric pain                            R10.11, Right upper quadrant pain CPT copyright 2019 American Medical Association. All rights reserved. The codes documented in this report are preliminary and upon coder review may  be revised to meet current compliance requirements. Lionel December, MD Lionel December, MD 08/31/2019 9:41:08 AM This report has been signed electronically. Number of Addenda: 0

## 2019-09-01 ENCOUNTER — Other Ambulatory Visit: Payer: Self-pay

## 2019-09-01 LAB — SURGICAL PATHOLOGY

## 2019-09-11 ENCOUNTER — Ambulatory Visit (INDEPENDENT_AMBULATORY_CARE_PROVIDER_SITE_OTHER): Payer: BC Managed Care – PPO | Admitting: Gastroenterology

## 2019-09-25 ENCOUNTER — Telehealth (INDEPENDENT_AMBULATORY_CARE_PROVIDER_SITE_OTHER): Payer: Self-pay | Admitting: Gastroenterology

## 2019-09-25 MED ORDER — PANTOPRAZOLE SODIUM 40 MG PO TBEC: 40.0000 mg | DELAYED_RELEASE_TABLET | Freq: Every day | ORAL | 0 refills | Status: DC

## 2019-09-25 NOTE — Telephone Encounter (Signed)
I called patient's wife, no answer.   I did discuss w/ patient at his number - he is not having as much RUQ pain with the dicyclomine - now having more LUQ pain, he has been on Protonix in the past which helped.  He did have gastritis on his EGD.  Will restart on Protonix, prescription sent to pharmacy.  He will call as instructed in March if not better would consider CT per pathology result

## 2019-09-25 NOTE — Telephone Encounter (Signed)
Wife called stated patient has still been having abdominal pain and nausea since his procedure on 08/31/19   -  Please advise - [phone]

## 2019-09-26 ENCOUNTER — Ambulatory Visit: Payer: BC Managed Care – PPO | Attending: Internal Medicine

## 2019-09-26 ENCOUNTER — Other Ambulatory Visit: Payer: Self-pay

## 2019-09-26 DIAGNOSIS — Z20822 Contact with and (suspected) exposure to covid-19: Secondary | ICD-10-CM

## 2019-09-27 LAB — NOVEL CORONAVIRUS, NAA: SARS-CoV-2, NAA: NOT DETECTED

## 2019-11-01 ENCOUNTER — Other Ambulatory Visit: Payer: Self-pay

## 2019-11-01 ENCOUNTER — Ambulatory Visit
Admission: EM | Admit: 2019-11-01 | Discharge: 2019-11-01 | Disposition: A | Discharge status: Home / Self Care | Payer: BC Managed Care – PPO | Attending: Emergency Medicine | Admitting: Emergency Medicine

## 2019-11-01 DIAGNOSIS — J209 Acute bronchitis, unspecified: Secondary | ICD-10-CM

## 2019-11-01 MED ORDER — AZITHROMYCIN 250 MG PO TABS: 250.0000 mg | ORAL_TABLET | Freq: Every day | ORAL | 0 refills | Status: DC

## 2019-11-01 NOTE — ED Triage Notes (Signed)
Pt presents with c/o cough and runny nose for past few days

## 2019-11-01 NOTE — ED Provider Notes (Signed)
[facility]    CSN: [id number] Arrival date & time: 11/01/19  1742      History   Chief Complaint Chief Complaint  Patient presents with  . Nasal Congestion    HPI Matthew Andrews is a 29 y.o. male.   Presents to the urgent care with a complaint of nasal congestion, runny nose and cough with green mucus for the past 1 week.  Denies sick exposure to COVID, flu or strep.  Denies recent travel.  Denies aggravating or alleviating symptoms.  Denies previous COVID infection.   Denies fever, chills, fatigue, sore throat, SOB, wheezing, chest pain, nausea, vomiting, changes in bowel or bladder habits.          Past Medical History:  Diagnosis Date  . Anxiety     Patient Active Problem List   Diagnosis Date Noted  . RUQ pain 08/03/2019  . Gastroesophageal reflux disease 06/08/2019  . Cervical spondylosis without myelopathy 04/26/2017  . 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 BIOPSY  08/31/2019   Procedure: BIOPSY;  Surgeon: Malissa Hippo, MD;  Location: [facility];  Service: Endoscopy;;  duodenum, gastric  . ESOPHAGOGASTRODUODENOSCOPY N/A 08/31/2019   Procedure: ESOPHAGOGASTRODUODENOSCOPY (EGD);  Surgeon: Malissa Hippo, MD;  Location: [facility];  Service: Endoscopy;  Laterality: N/A;  2:50  . knee surgery Left   . LUMBAR SPINE SURGERY     2014       Home Medications    Prior to Admission medications   Medication Sig Start Date End Date Taking? Authorizing Provider  azithromycin (ZITHROMAX) 250 MG tablet Take 1 tablet (250 mg total) by mouth daily. Take first 2 tablets together, then 1 every day until finished. 11/01/19   Co[place] Near[place], Zachery Dakins, FNP  dicyclomine (BENTYL) 10 MG capsule Take 1 capsule (10 mg total) by mouth 3 (three) times daily before meals. 08/31/19   Rehman, Joline Maxcy, MD  pantoprazole (PROTONIX) 40 MG tablet Take 1 tablet (40 mg total) by mouth daily. 09/25/19   Levonne Hubert,  PA-C    Family History Family History  Problem Relation Age of Onset  . Cancer Father     Social History Social History   Tobacco Use  . Smoking status: Never Smoker  . Smokeless tobacco: Never Used  Substance Use Topics  . Alcohol use: Yes  . Drug use: Never     Allergies   Patient has no known allergies.   Review of Systems Review of Systems  Constitutional: Negative.   HENT: Positive for congestion and rhinorrhea.   Respiratory: Positive for cough.   Cardiovascular: Negative.   Gastrointestinal: Negative.   Neurological: Negative.   All other systems reviewed and are negative.    Physical Exam Triage Vital Signs ED Triage Vitals  Enc Vitals Group     BP 11/01/19 1748 112/75     Pulse Rate 11/01/19 1748 75     Resp 11/01/19 1748 17     Temp 11/01/19 1748 97.6 F (36.4 C)     Temp Source 11/01/19 1748 Oral     SpO2 11/01/19 1748 98 %     Weight --      Height --      Head Circumference --      Peak Flow --      Pain Score 11/01/19 1758 0     Pain Loc --      Pain Edu? --  Excl. in GC? --    No data found.  Updated Vital Signs BP 112/75 (BP Location: Right Arm)   Pulse 75   Temp 97.6 F (36.4 C) (Oral)   Resp 17   SpO2 98%   Visual Acuity Right Eye Distance:   Left Eye Distance:   Bilateral Distance:    Right Eye Near:   Left Eye Near:    Bilateral Near:     Physical Exam Vitals and nursing note reviewed.  Constitutional:      General: He is not in acute distress.    Appearance: Normal appearance. He is normal weight. He is not ill-appearing or toxic-appearing.  HENT:     Head: Normocephalic.     Right Ear: Tympanic membrane, ear canal and external ear normal. There is no impacted cerumen.     Left Ear: Tympanic membrane, ear canal and external ear normal. There is no impacted cerumen.     Nose: Nose normal. No congestion.     Mouth/Throat:     Mouth: Mucous membranes are moist.     Pharynx: Oropharynx is clear. No  oropharyngeal exudate or posterior oropharyngeal erythema.  Cardiovascular:     Rate and Rhythm: Normal rate and regular rhythm.     Pulses: Normal pulses.     Heart sounds: Normal heart sounds. No murmur.  Pulmonary:     Effort: Pulmonary effort is normal. No respiratory distress.     Breath sounds: Normal breath sounds. No wheezing or rhonchi.  Chest:     Chest wall: No tenderness.  Skin:    Capillary Refill: Capillary refill takes less than 2 seconds.  Neurological:     Mental Status: He is alert and oriented to person, place, and time.      UC Treatments / Results  Labs (all labs ordered are listed, but only abnormal results are displayed) Labs Reviewed - No data to display  EKG   Radiology No results found.  Procedures Procedures (including critical care time)  Medications Ordered in UC Medications - No data to display  Initial Impression / Assessment and Plan / UC Course  I have reviewed the triage vital signs and the nursing notes.  Pertinent labs & imaging results that were available during my care of the patient were reviewed by me and considered in my medical decision making (see chart for details).    Patient is stable at discharge. Azithromycin was prescribed for bronchitis Patient was advised to continue to take Flonase and Allegra-D as prescribed To return if symptoms does not resolve.  Patient is a in agreement.  Final Clinical Impressions(s) / UC Diagnoses   Final diagnoses:  Acute bronchitis, unspecified organism     Discharge Instructions     Azithromycin prescribed for possible bronchitis. Continue to take Allegra-D for allergy Use Flonase as prescribed for allergy Return if symptoms does not resolve to rule out COVID-19    ED Prescriptions    Medication Sig Dispense Auth. Provider   azithromycin (ZITHROMAX) 250 MG tablet Take 1 tablet (250 mg total) by mouth daily. Take first 2 tablets together, then 1 every day until finished. 6  tablet Amamda Curbow, Darrelyn Hillock, FNP     PDMP not reviewed this encounter.   Emerson Monte, FNP 11/01/19 1806

## 2019-11-01 NOTE — Discharge Instructions (Signed)
Azithromycin prescribed for possible bronchitis. Continue to take Allegra-D for allergy Use Flonase as prescribed for allergy Return if symptoms does not resolve to rule out COVID-19

## 2019-11-06 ENCOUNTER — Encounter (INDEPENDENT_AMBULATORY_CARE_PROVIDER_SITE_OTHER): Payer: Self-pay

## 2020-01-10 ENCOUNTER — Other Ambulatory Visit: Payer: Self-pay

## 2020-01-10 ENCOUNTER — Encounter (INDEPENDENT_AMBULATORY_CARE_PROVIDER_SITE_OTHER): Payer: Self-pay | Admitting: Gastroenterology

## 2020-01-10 ENCOUNTER — Encounter (INDEPENDENT_AMBULATORY_CARE_PROVIDER_SITE_OTHER): Payer: Self-pay | Admitting: *Deleted

## 2020-01-10 ENCOUNTER — Ambulatory Visit (INDEPENDENT_AMBULATORY_CARE_PROVIDER_SITE_OTHER): Payer: BC Managed Care – PPO | Admitting: Gastroenterology

## 2020-01-10 VITALS — BP 119/83 | HR 79 | Temp 97.0°F | Ht 72.0 in | Wt 176.3 lb

## 2020-01-10 DIAGNOSIS — K219 Gastro-esophageal reflux disease without esophagitis: Secondary | ICD-10-CM

## 2020-01-10 DIAGNOSIS — R1011 Right upper quadrant pain: Secondary | ICD-10-CM

## 2020-01-10 MED ORDER — DICYCLOMINE HCL 10 MG PO CAPS: 10.0000 mg | ORAL_CAPSULE | Freq: Three times a day (TID) | ORAL | 1 refills | Status: DC | PRN

## 2020-01-10 NOTE — Patient Instructions (Signed)
We are checking test to evaluate gallbaldder function.  Okay to use bentyl/dicyclomine as needed as discussed prior to trigger foods or large meals.

## 2020-01-10 NOTE — Progress Notes (Addendum)
History of Present Illness: Matthew Andrews is seen today for follow up, last seen January 2021 for endoscopy.  He reports that the dicyclomine 3 times daily helped his right upper quadrant - episodes became less frequent and less intense, he stopped taking the dicyclomine in February or March 2021. He has continued to have episodes 2-3 times a week. He has noticed significant food triggers of spicy or greasy foods, symptoms also worsened when he was on vacation and eating more. Episodes are described as stabbing pain in his right upper quadrant that are postprandial. Associated with mild nausea.  He denies any vomiting.  He has no heartburn symptoms at this time.  He tried pantoprazole but this caused joint pains specifically in his hands and stopped pantoprazole, denies significant improvement in his right upper quadrant pain with the PPI prior to stopping.  He did decrease monster drinks with the symptoms.  Non-smoker alcohol less than once a month.  No NSAID use  He reports he is having daily bowel movements but these are fairly loose once a day. Overall diarrhea better than at last OV. He denies any lower abdominal pain or blood in stools.  Wt Readings from Last 3 Encounters:  01/10/20 176 lb 4.8 oz (80 kg)  08/03/19 181 lb 8 oz (82.3 kg)  06/14/19 179 lb 1.3 oz (81.2 kg)      Last Endoscopy:  08/2018--Normal esophagus. - Z-line regular, 41 cm from the incisors. - Gastritis. Biopsied. - Normal duodenal bulb and second portion of the duodenum. Biopsied.   Past Medical History:  Past Medical History:  Diagnosis Date   Anxiety     Problem List: Patient Active Problem List   Diagnosis Date Noted   RUQ pain 08/03/2019   Gastroesophageal reflux disease 06/08/2019   Cervical spondylosis without myelopathy 04/26/2017   Spondylolysis, lumbar region 04/26/2017    Past Surgical History: Past Surgical History:  Procedure Laterality Date   BIOPSY  08/31/2019   Procedure: BIOPSY;   Surgeon: Malissa Hippo, MD;  Location: [facility];  Service: Endoscopy;;  duodenum, gastric   ESOPHAGOGASTRODUODENOSCOPY N/A 08/31/2019   Procedure: ESOPHAGOGASTRODUODENOSCOPY (EGD);  Surgeon: Malissa Hippo, MD;  Location: [facility];  Service: Endoscopy;  Laterality: N/A;  2:50   knee surgery Left    LUMBAR SPINE SURGERY     2014    Allergies: No Known Allergies    Home Medications:  Current Outpatient Medications:    dicyclomine (BENTYL) 10 MG capsule, Take 1 capsule (10 mg total) by mouth 3 (three) times daily before meals., Disp: 90 capsule, Rfl: 1   fexofenadine (ALLEGRA) 180 MG tablet, Take 180 mg by mouth daily., Disp: , Rfl:    pantoprazole (PROTONIX) 40 MG tablet, Take 1 tablet (40 mg total) by mouth daily. (Patient not taking: Reported on 01/10/2020), Disp: 90 tablet, Rfl: 0   Family History: family history includes Cancer in his father.  - Melanoma age 42.   Mom's sister w/ gallbladder disease.    Social History:   reports that he has never smoked. He has never used smokeless tobacco. He reports current alcohol use. He reports that he does not use drugs.    Review of Systems: Constitutional: Denies weight loss/weight gain  Eyes: No changes in vision. ENT: No oral lesions, sore throat.  GI: see HPI.  Heme/Lymph: No easy bruising.  CV: No chest pain.  GU: No hematuria.  Integumentary: No rashes.  Neuro: No headaches.  Psych: No depression/anxiety.  Endocrine: No heat/cold intolerance.  Allergic/Immunologic: No urticaria.  Resp: No cough, SOB.  Musculoskeletal: No joint swelling.    Physical Examination: BP 119/83 (BP Location: Right Arm, Patient Position: Sitting, Cuff Size: Large)   Pulse 79   Temp (!) 97 F (36.1 C) (Temporal)   Ht 6' (1.829 m)   Wt 176 lb 4.8 oz (80 kg)   BMI 23.91 kg/m  Gen: NAD, alert and oriented x 4 HEENT: PEERLA, EOMI, Neck: supple, no JVD Chest: CTA bilaterally, no wheezes, crackles, or other adventitious sounds CV:  RRR, no m/g/c/r Abd: soft, NT, ND, +BS in all four quadrants; no HSM, guarding, ridigity, or rebound tenderness Ext: no edema, well perfused with 2+ pulses, Skin: no rash or lesions noted on observed skin Lymph: no noted LAD  Data Reviewed:  Labs 07/2019--lipase and LFTS norma  [place] 07/2019-negative for gallstones    Assessment/Plan: Mr. Matthew Andrews is a 29 y.o. male    Matthew Andrews was seen today for follow-up.  Diagnoses and all orders for this visit:  Chronic GERD  RUQ pain    1.  Right upper quadrant pain-work-up with ultrasound was negative for gallstones and EGD with gastritis but did not feel PPI helped symptoms and stopped due to side effect of joint pain.  He has had some benefit with dicyclomine he can continue to use this as needed prior to large meals.  Given ultrasound EGD unremarkable and still having symptoms fairly often will proceed with HIDA scan to exclude biliary issues.  Differential also includes functional dyspepsia.  Diet modifications discussed.  Follow-up pending HIDA results.     I personally performed the service, non-incident to. (WP)  Laurine Blazer, [facility]

## 2020-01-21 ENCOUNTER — Ambulatory Visit
Admission: EM | Admit: 2020-01-21 | Discharge: 2020-01-21 | Disposition: A | Discharge status: Home / Self Care | Payer: BC Managed Care – PPO | Attending: Emergency Medicine | Admitting: Emergency Medicine

## 2020-01-21 ENCOUNTER — Encounter: Payer: Self-pay | Admitting: Emergency Medicine

## 2020-01-21 DIAGNOSIS — K529 Noninfective gastroenteritis and colitis, unspecified: Secondary | ICD-10-CM | POA: Diagnosis not present

## 2020-01-21 MED ORDER — ONDANSETRON HCL 4 MG PO TABS: 4.0000 mg | ORAL_TABLET | Freq: Three times a day (TID) | ORAL | 0 refills | Status: DC | PRN

## 2020-01-21 NOTE — ED Provider Notes (Addendum)
[facility]   [id number] 01/21/20 Arrival Time: 1152  CC: ABDOMINAL DISCOMFORT  SUBJECTIVE:  Matthew Andrews is a 29 y.o. male who presents to the urgent care with a complaint of chills, nausea, vomiting and abdominal pain that started last night.  Admitted for emesis last night and one this morning.  Reported liquid in color.  He is having  CT scan next week for gallbladder work-up.  Denies a precipitating event, trauma, close contacts with similar symptoms, recent travel or antibiotic use.  Currently denies abdominal pain.  Report abdominal pain is achy in character.  Has not tried any OTC medication.  Denies alleviating or aggravating factors.  Denies similar symptoms in the past.  Last BM 01/20/2020.  Denies  Fever, appetite changes, weight changes, chest pain, SOB, diarrhea, constipation, hematochezia, melena, dysuria, difficulty urinating, increased frequency or urgency, flank pain, loss of bowel or bladder function,  pelvic pain.     No LMP for male patient.  ROS: As per HPI.  All other pertinent ROS negative.     Past Medical History:  Diagnosis Date  . Anxiety    Past Surgical History:  Procedure Laterality Date  . BIOPSY  08/31/2019   Procedure: BIOPSY;  Surgeon: Malissa Hippo, MD;  Location: [facility];  Service: Endoscopy;;  duodenum, gastric  . ESOPHAGOGASTRODUODENOSCOPY N/A 08/31/2019   Procedure: ESOPHAGOGASTRODUODENOSCOPY (EGD);  Surgeon: Malissa Hippo, MD;  Location: [facility];  Service: Endoscopy;  Laterality: N/A;  2:50  . knee surgery Left   . LUMBAR SPINE SURGERY     2014   No Known Allergies No current facility-administered medications on file prior to encounter.   Current Outpatient Medications on File Prior to Encounter  Medication Sig Dispense Refill  . dicyclomine (BENTYL) 10 MG capsule Take 1 capsule (10 mg total) by mouth 3 (three) times daily as needed for spasms (RUQ Pain). 90 capsule 1  . fexofenadine (ALLEGRA) 180 MG tablet Take  180 mg by mouth daily.    . pantoprazole (PROTONIX) 40 MG tablet Take 1 tablet (40 mg total) by mouth daily. (Patient not taking: Reported on 01/10/2020) 90 tablet 0   Social History   Socioeconomic History  . Marital status: Married    Spouse name: Matthew Andrews   . Number of children: 2  . Years of education: Not on file  . Highest education level: High school graduate  Occupational History  . Occupation: Chief Financial Officer    Comment: tree trimmer   Tobacco Use  . Smoking status: Never Smoker  . Smokeless tobacco: Never Used  Substance and Sexual Activity  . Alcohol use: Yes  . Drug use: Never  . Sexual activity: Yes  Other Topics Concern  . Not on file  Social History Narrative   Lives with wife Matthew Andrews married a year in half, dated for 5 years before    2 children   Daughter 4- Matthew Andrews    Son 2- Matthew Andrews       Pets: 2 dogs: Matthew Andrews: Matthew Andrews   Mutt: Matthew Andrews: Matthew Andrews       Enjoys: watching football, outside, family and friends      Diet: junk food-chips, red meat, chicken, veggies and fruits, salads, was eating a lot of spicy foods, but trying to reduced   Water: 1 bottle a day   Caffiene: tea 2-3       Wears seat belt    Wears sun protection   Smoke detectors    Does  not use phone while driving   Social Determinants of Health   Financial Resource Strain: Low Risk   . Difficulty of Paying Living Expenses: Not hard at all  Food Insecurity: No Food Insecurity  . Worried About Charity fundraiser in the Last Year: Never true  . Ran Out of Food in the Last Year: Never true  Transportation Needs: No Transportation Needs  . Lack of Transportation (Medical): No  . Lack of Transportation (Non-Medical): No  Physical Activity: Sufficiently Active  . Days of Exercise per Week: 5 days  . Minutes of Exercise per Session: 30 min  Stress: Stress Concern Present  . Feeling of Stress : To some extent  Social Connections: Slightly Isolated  . Frequency of Communication with  Friends and Family: More than three times a week  . Frequency of Social Gatherings with Friends and Family: More than three times a week  . Attends Religious Services: More than 4 times per year  . Active Member of Clubs or Organizations: No  . Attends Archivist Meetings: Never  . Marital Status: Married  Human resources officer Violence: Not At Risk  . Fear of Current or Ex-Partner: No  . Emotionally Abused: No  . Physically Abused: No  . Sexually Abused: No   Family History  Problem Relation Age of Onset  . Cancer Father      OBJECTIVE:  Vitals:   01/21/20 1202  BP: 116/77  Pulse: 86  Resp: 16  Temp: 97.6 F (36.4 C)  TempSrc: Oral  SpO2: 98%    General appearance: Alert; NAD HEENT: NCAT.  Oropharynx clear.  Lungs: clear to auscultation bilaterally without adventitious breath sounds Heart: regular rate and rhythm.  Radial pulses 2+ symmetrical bilaterally Abdomen: soft, non-distended; normal active bowel sounds; non-tender to light and deep palpation; nontender at McBurney's point; negative Murphy's sign; negative rebound; no guarding Back: no CVA tenderness Extremities: no edema; symmetrical with no gross deformities Skin: warm and dry Neurologic: normal gait Psychological: alert and cooperative; normal mood and affect  LABS: No results found for this or any previous visit (from the past 24 hour(s)).  DIAGNOSTIC STUDIES: No results found.   ASSESSMENT & PLAN:  1. Gastroenteritis     Meds ordered this encounter  Medications  . ondansetron (ZOFRAN) 4 MG tablet    Sig: Take 1 tablet (4 mg total) by mouth every 8 (eight) hours as needed for nausea or vomiting.    Dispense:  20 tablet    Refill:  0    Discharge instructions Get rest and drink fluids Zofran prescribed.  Take as directed.    DIET Instructions:  30 minutes after taking nausea medicine, begin with sips of clear liquids. If able to hold down 2 - 4 ounces for 30 minutes, begin drinking  more. Increase your fluid intake to replace losses. Clear liquids only for 24 hours (water, tea, sport drinks, clear flat ginger ale or cola and juices, broth, jello, popsicles, ect). Advance to bland foods, applesauce, rice, baked or boiled chicken, ect. Avoid milk, greasy foods and anything that doesn't agree with you.  If you experience new or worsening symptoms return or go to ER such as fever, chills, nausea, vomiting, diarrhea, bloody or dark tarry stools, constipation, urinary symptoms, worsening abdominal discomfort, symptoms that do not improve with medications, inability to keep fluids down, etc...  Reviewed expectations re: course of current medical issues. Questions answered. Outlined signs and symptoms indicating need for more acute intervention. Patient verbalized  understanding. After Visit Summary given.   Durward Parcel, FNP 01/21/20 1233    Durward Parcel, FNP 01/21/20 1234

## 2020-01-21 NOTE — ED Triage Notes (Signed)
Has been seeing GI doctor for different issues.  Will have gallbladder workup this week.  Started chills last.  Pt had vomiting episode last night.  Felt like couldn't caught his breath after he finally stopped throwing up.  Having hot waves since last night.

## 2020-01-21 NOTE — Discharge Instructions (Signed)

## 2020-01-26 ENCOUNTER — Encounter (HOSPITAL_COMMUNITY): Payer: Self-pay

## 2020-01-26 ENCOUNTER — Encounter (HOSPITAL_COMMUNITY)
Admission: RE | Admit: 2020-01-26 | Discharge: 2020-01-26 | Disposition: A | Discharge status: Home / Self Care | Payer: BC Managed Care – PPO | Source: Ambulatory Visit | Attending: Gastroenterology | Admitting: Gastroenterology

## 2020-01-26 ENCOUNTER — Other Ambulatory Visit: Payer: Self-pay

## 2020-01-26 DIAGNOSIS — R112 Nausea with vomiting, unspecified: Secondary | ICD-10-CM | POA: Diagnosis not present

## 2020-01-26 DIAGNOSIS — R1011 Right upper quadrant pain: Secondary | ICD-10-CM | POA: Diagnosis not present

## 2020-01-26 MED ORDER — TECHNETIUM TC 99M MEBROFENIN IV KIT
5.0000 | PACK | Freq: Once | INTRAVENOUS | Status: AC | PRN
  Administered 2020-01-26: 5.15 via INTRAVENOUS

## 2020-02-21 ENCOUNTER — Encounter: Payer: BC Managed Care – PPO | Admitting: Family Medicine

## 2020-04-05 DIAGNOSIS — U071 COVID-19: Secondary | ICD-10-CM

## 2020-04-05 HISTORY — DX: COVID-19: U07.1

## 2020-04-10 ENCOUNTER — Telehealth: Payer: Self-pay

## 2020-04-10 DIAGNOSIS — U071 COVID-19: Secondary | ICD-10-CM | POA: Diagnosis not present

## 2020-04-10 DIAGNOSIS — R509 Fever, unspecified: Secondary | ICD-10-CM | POA: Diagnosis not present

## 2020-04-10 DIAGNOSIS — R05 Cough: Secondary | ICD-10-CM | POA: Diagnosis not present

## 2020-04-10 NOTE — Telephone Encounter (Signed)
Patient was not hospitalized. Went over everything with him and he understands.

## 2020-04-10 NOTE — Telephone Encounter (Signed)
If they admit him, he can request to be moved to [facility]. Please tell him to follow any guidelines provided. Family will need to quarantine if symptoms they will need testing as well.

## 2020-04-10 NOTE — Telephone Encounter (Signed)
Noted, thank you

## 2020-04-10 NOTE — Telephone Encounter (Signed)
Pt called to make Matthew Andrews aware of his current situation he is currently being seen at [facility] in [place] he is being tested for COVID and is O2 levels were 88% on arrival temp 100.6 dark stools, pain all over, and headache.  Pt also asking if admitted could he transferred to [facility]. Chest xray seems normal per pt.  Pt just called back stating that he was positive for COVID.

## 2020-04-14 ENCOUNTER — Encounter (HOSPITAL_COMMUNITY): Payer: Self-pay | Admitting: Emergency Medicine

## 2020-04-14 ENCOUNTER — Emergency Department (HOSPITAL_COMMUNITY)
Admission: EM | Admit: 2020-04-14 | Discharge: 2020-04-14 | Disposition: A | Discharge status: Home / Self Care | Payer: BC Managed Care – PPO | Attending: Emergency Medicine | Admitting: Emergency Medicine

## 2020-04-14 ENCOUNTER — Emergency Department (HOSPITAL_COMMUNITY): Payer: BC Managed Care – PPO

## 2020-04-14 ENCOUNTER — Other Ambulatory Visit: Payer: Self-pay

## 2020-04-14 DIAGNOSIS — U071 COVID-19: Secondary | ICD-10-CM | POA: Diagnosis not present

## 2020-04-14 DIAGNOSIS — M5431 Sciatica, right side: Secondary | ICD-10-CM | POA: Insufficient documentation

## 2020-04-14 DIAGNOSIS — M5441 Lumbago with sciatica, right side: Secondary | ICD-10-CM

## 2020-04-14 DIAGNOSIS — Z8616 Personal history of COVID-19: Secondary | ICD-10-CM | POA: Insufficient documentation

## 2020-04-14 DIAGNOSIS — M5432 Sciatica, left side: Secondary | ICD-10-CM | POA: Insufficient documentation

## 2020-04-14 DIAGNOSIS — Z79899 Other long term (current) drug therapy: Secondary | ICD-10-CM | POA: Diagnosis not present

## 2020-04-14 DIAGNOSIS — Z981 Arthrodesis status: Secondary | ICD-10-CM | POA: Diagnosis not present

## 2020-04-14 DIAGNOSIS — M545 Low back pain: Secondary | ICD-10-CM | POA: Diagnosis not present

## 2020-04-14 MED ORDER — METHOCARBAMOL 500 MG PO TABS
500.0000 mg | ORAL_TABLET | Freq: Three times a day (TID) | ORAL | 0 refills | Status: DC
Start: 2020-04-14 — End: 2020-05-22

## 2020-04-14 MED ORDER — NAPROXEN 500 MG PO TABS: 500.0000 mg | ORAL_TABLET | Freq: Two times a day (BID) | ORAL | 0 refills | Status: DC

## 2020-04-14 NOTE — Discharge Instructions (Addendum)
The x-rays of your lower back did not show evidence of misalignment of your spine or problems with the surgical hardware.  Avoid twisting of your back, bending over or heavy lifting for at least 1 week.  Follow-up with your primary care provider in 1 week if your symptoms are not improving

## 2020-04-14 NOTE — ED Triage Notes (Signed)
Dx with covid last week   Been isolating at home   Last night numbness and pain to left leg  10/10

## 2020-04-15 NOTE — ED Provider Notes (Signed)
[facility] EMERGENCY DEPARTMENT Provider Note   CSN: [id number] Arrival date & time: 04/14/20  1505     History Chief Complaint  Patient presents with  . Leg Pain    covid pos    Matthew Andrews is a 29 y.o. male.  HPI      Matthew Andrews is a 29 y.o. male who presents to the Emergency Department complaining of low back pain and intermittent tingling and numbness of the left upper leg.  Symptoms have been present for several days.  He was diagnosed with COVID last week and states that he has been lying around the house and not as active as usual since his diagnosis.  He states that he feels that he is improving from Covid.  He denies any known injuries.  He describes the pain as aching and worse with movement.  Improves somewhat when standing and at rest.  He states his symptoms resolve when he is lying in the fetal position.  He has tried over-the-counter pain relievers without significant improvement.  He denies abdominal pain, urine or bowel changes, fever or chills, pain swelling or redness of his lower legs.  No saddle anesthesias.   Past Medical History:  Diagnosis Date  . Anxiety   . COVID-19 04/05/2020    Patient Active Problem List   Diagnosis Date Noted  . RUQ pain 08/03/2019  . Gastroesophageal reflux disease 06/08/2019  . Cervical spondylosis without myelopathy 04/26/2017  . 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 BIOPSY  08/31/2019   Procedure: BIOPSY;  Surgeon: Malissa Hippo, MD;  Location: [facility];  Service: Endoscopy;;  duodenum, gastric  . ESOPHAGOGASTRODUODENOSCOPY N/A 08/31/2019   Procedure: ESOPHAGOGASTRODUODENOSCOPY (EGD);  Surgeon: Malissa Hippo, MD;  Location: [facility];  Service: Endoscopy;  Laterality: N/A;  2:50  . knee surgery Left   . LUMBAR SPINE SURGERY     2014       Family History  Problem Relation Age of Onset  . Cancer Father     Social History   Tobacco  Use  . Smoking status: Never Smoker  . Smokeless tobacco: Never Used  Vaping Use  . Vaping Use: Never used  Substance Use Topics  . Alcohol use: Yes  . Drug use: Never    Home Medications Prior to Admission medications   Medication Sig Start Date End Date Taking? Authorizing Provider  dicyclomine (BENTYL) 10 MG capsule Take 1 capsule (10 mg total) by mouth 3 (three) times daily as needed for spasms (RUQ Pain). 01/10/20   Levonne Hubert, PA-C  fexofenadine (ALLEGRA) 180 MG tablet Take 180 mg by mouth daily.    [provider]  methocarbamol (ROBAXIN) 500 MG tablet Take 1 tablet (500 mg total) by mouth 3 (three) times daily. 04/14/20   Veyda Kaufman, PA-C  naproxen (NAPROSYN) 500 MG tablet Take 1 tablet (500 mg total) by mouth 2 (two) times daily with a meal. 04/14/20   Jia Dottavio, PA-C  ondansetron (ZOFRAN) 4 MG tablet Take 1 tablet (4 mg total) by mouth every 8 (eight) hours as needed for nausea or vomiting. 01/21/20   Avegno, Zachery Dakins, FNP  pantoprazole (PROTONIX) 40 MG tablet Take 1 tablet (40 mg total) by mouth daily. Patient not taking: Reported on 01/10/2020 09/25/19   Levonne Hubert, PA-C    Allergies    Patient has no known allergies.  Review of Systems   Review of  Systems  Constitutional: Negative for activity change, appetite change and fever.  Respiratory: Negative for shortness of breath.   Cardiovascular: Negative for chest pain.  Gastrointestinal: Negative for abdominal pain, constipation, diarrhea and vomiting.  Genitourinary: Negative for decreased urine volume, difficulty urinating, dysuria, flank pain and hematuria.  Musculoskeletal: Positive for back pain. Negative for joint swelling, neck pain and neck stiffness.  Skin: Negative for rash.  Neurological: Negative for speech difficulty, weakness, numbness and headaches.  Psychiatric/Behavioral: Negative for confusion.    Physical Exam Updated Vital Signs BP 114/85 (BP Location: Left Arm)    Pulse 79   Temp 98.1 F (36.7 C) (Oral)   Resp 16   Ht 5\' 11"  (1.803 m)   Wt 76.6 kg   SpO2 100%   BMI 23.55 kg/m   Physical Exam Vitals and nursing note reviewed.  Constitutional:      General: He is not in acute distress.    Appearance: Normal appearance. He is well-developed. He is not ill-appearing or toxic-appearing.  HENT:     Head: Normocephalic and atraumatic.  Cardiovascular:     Rate and Rhythm: Normal rate and regular rhythm.     Pulses: Normal pulses.     Comments: DP pulses are strong and palpable bilaterally Pulmonary:     Effort: Pulmonary effort is normal. No respiratory distress.     Breath sounds: Normal breath sounds.  Chest:     Chest wall: No tenderness.  Abdominal:     General: There is no distension.     Palpations: Abdomen is soft.     Tenderness: There is no abdominal tenderness.  Musculoskeletal:        General: Tenderness present. No swelling or signs of injury.     Cervical back: Normal range of motion and neck supple. No tenderness.     Lumbar back: Tenderness present. No swelling, deformity or lacerations. Normal range of motion.     Right lower leg: No edema.     Left lower leg: No edema.     Comments: ttp of the lower lumbar spine and left lumbar paraspinal muscles.  Pt has 5/5 strength against resistance of bilateral lower extremities.  Hip flexors and extensors intact.  No calf tenderness, edema or erythema   Skin:    General: Skin is warm and dry.     Capillary Refill: Capillary refill takes less than 2 seconds.     Findings: No rash.  Neurological:     General: No focal deficit present.     Mental Status: He is alert and oriented to person, place, and time.     Sensory: No sensory deficit.     Motor: No weakness or abnormal muscle tone.     Coordination: Coordination normal.     Gait: Gait normal.     Deep Tendon Reflexes:     Reflex Scores:      Patellar reflexes are 2+ on the right side and 2+ on the left side.      Achilles  reflexes are 2+ on the right side and 2+ on the left side.    ED Results / Procedures / Treatments   Labs (all labs ordered are listed, but only abnormal results are displayed) Labs Reviewed - No data to display  EKG None  Radiology DG Lumbar Spine Complete  Result Date: 04/14/2020 CLINICAL DATA:  Leg pain, low back pain, hx of lumbar surgery(fusion; 2014). EXAM: LUMBAR SPINE - COMPLETE 4+ VIEW COMPARISON:  Lumbar spine radiographs 06/14/2019 FINDINGS:  No evidence of fracture. Alignment appears stable. Status post L5-S1 fusion with intact hardware. No significant degenerative changes. No focal bone lesion. SI joints are open and symmetric. Nonobstructive bowel gas pattern. IMPRESSION: No acute osseous abnormality of the lumbar spine. Electronically Signed   By: Emmaline Kluver M.D.   On: 04/14/2020 17:45    Procedures Procedures (including critical care time)  Medications Ordered in ED Medications - No data to display  ED Course  I have reviewed the triage vital signs and the nursing notes.  Pertinent labs & imaging results that were available during my care of the patient were reviewed by me and considered in my medical decision making (see chart for details).    MDM Rules/Calculators/A&P                         Patient with low back pain and intermittent tingling and numbness of the left thigh.  Symptoms present for several days.  No known injury.  He is well-appearing.  Ambulatory and his gait is steady.  No focal neuro deficits.  He has prior lumbar fusion.  X-ray of the L-spine shows the hardware is intact, no significant degenerative change.  No bony lesions.  On exam, he has focal tenderness of the lower lumbar spine and left paraspinal muscles.  No concerning symptoms for cauda equina.  No recent procedures to suggest spinal abscess.  Mild, intermittent radicular symptoms to the left thigh.  No calf pain tenderness or erythema.  No clinical symptoms to suggest DVT.  Vital  signs reassuring.  Patient agrees to conservative treatment and he will follow-up with PCP in 1 week if not improving.  I feel that he is appropriate for discharge home, and agrees to return precautions as discussed.     Final Clinical Impression(s) / ED Diagnoses Final diagnoses:  Acute midline low back pain with bilateral sciatica    Rx / DC Orders ED Discharge Orders         Ordered    methocarbamol (ROBAXIN) 500 MG tablet  3 times daily        04/14/20 1833    naproxen (NAPROSYN) 500 MG tablet  2 times daily with meals        04/14/20 1833           Pauline Aus, PA-C 04/15/20 1423    Eber Hong, MD 04/20/20 [phone]

## 2020-04-16 ENCOUNTER — Telehealth (INDEPENDENT_AMBULATORY_CARE_PROVIDER_SITE_OTHER): Payer: BC Managed Care – PPO | Admitting: Family Medicine

## 2020-04-16 ENCOUNTER — Other Ambulatory Visit: Payer: Self-pay

## 2020-04-16 ENCOUNTER — Encounter: Payer: Self-pay | Admitting: Family Medicine

## 2020-04-16 VITALS — BP 114/85 | Ht 71.0 in | Wt 168.0 lb

## 2020-04-16 DIAGNOSIS — M5386 Other specified dorsopathies, lumbar region: Secondary | ICD-10-CM | POA: Diagnosis not present

## 2020-04-16 DIAGNOSIS — M4306 Spondylolysis, lumbar region: Secondary | ICD-10-CM

## 2020-04-16 DIAGNOSIS — Z09 Encounter for follow-up examination after completed treatment for conditions other than malignant neoplasm: Secondary | ICD-10-CM | POA: Diagnosis not present

## 2020-04-16 MED ORDER — PREDNISONE 10 MG (21) PO TBPK: ORAL_TABLET | ORAL | 0 refills | Status: DC

## 2020-04-16 NOTE — Assessment & Plan Note (Signed)
No improvement in symptoms in the last 3 days after receiving treatment at the emergency room.  We will do a short course of prednisone to see if this will be helpful.  Secondary to having back surgery before we will send him to orthopedics if no improvement is noted.  No changes on x-ray however he might have changes on MRI. Advised to follow up if no improvement.Patient acknowledged agreement and understanding of the plan.

## 2020-04-16 NOTE — Patient Instructions (Addendum)
I appreciate the opportunity to provide you with care for your health and wellness. Today we discussed: recent back pain  Follow up: Dec for CPE -fasting morning appt   No labs or referrals today  Continue taking medications at the ED prescribed to you as needed.  In addition take the prednisone as prescribed.  If things do not continue to help make you feel better I think a referral back to the orthopedist to make sure there is no changes in your spine would be a good idea.  Please continue to practice social distancing to keep you, your family, and our community safe.  If you must go out, please wear a mask and practice good handwashing.  It was a pleasure to see you and I look forward to continuing to work together on your health and well-being. Please do not hesitate to call the office if you need care or have questions about your care.  Have a wonderful day and week. With Gratitude, Tereasa Coop, DNP, AGNP-BC

## 2020-04-16 NOTE — Assessment & Plan Note (Signed)
Recent hospital treatment review of notes, labs, orders. No new labs or tests needed.  Change in medication for prednisone use.  Follow-up if no change in symptoms.

## 2020-04-16 NOTE — Assessment & Plan Note (Signed)
No changes to hardware, however, might need ortho or neuro sx follow up if symptoms do not improve.

## 2020-04-16 NOTE — Progress Notes (Signed)
Virtual Visit via Telephone Note   This visit type was conducted due to national recommendations for restrictions regarding the COVID-19 Pandemic (e.g. social distancing) in an effort to limit this patient's exposure and mitigate transmission in our community.  Due to his co-morbid illnesses, this patient is at least at moderate risk for complications without adequate follow up.  This format is felt to be most appropriate for this patient at this time.  The patient did not have access to video technology/had technical difficulties with video requiring transitioning to audio format only (telephone).  All issues noted in this document were discussed and addressed.  No physical exam could be performed with this format.    Evaluation Performed:  Follow-up visit  Date:  04/16/2020   ID:  Matthew Andrews, DOB [birth year], MRN [id number]  Patient Location: Home Provider Location: Office/Clinic  Location of Patient: Home Location of Provider: Telehealth Consent was obtain for visit to be over via telehealth. I verified that I am speaking with the correct person using two identifiers.  PCP:  Freddy Finner, NP   Chief Complaint: Recent hospital treatment for back pain/sciatica  History of Present Illness:    Matthew Andrews is a 29 y.o. male with with history of anxiety, recent Covid positive test. Recently reported to the emergency room on August 29 complaining of low back pain with intermittent tingling numbness of the left upper leg.  The symptoms have presented for several days.  He had recently been diagnosed with Covid in the last week at that time.  He has been laying around the house and was not as active as he usually is prior to his diagnosis.  He reports that he is feeling good overall from Covid only symptom is that he does not have any sense of taste or smell.  And that he is fatigued.  He denies any known injuries to the back prior to this.  He describes the pain as achy and worse  with movement.  Improves when he is standing sometimes and at rest.  He states his symptoms resolved when he is laying down in a fetal like position.  He has tried over-the-counter relievers without significant improvement.  He denies any abdominal pain, urine or bowel changes, fever, chills, pain or swelling or redness in the lower legs.  Denies any saddle anesthesias.  Of note he has had lumbar spine surgery with fusion of  L5-S1.  X-ray in the emergency room is promising that nothing is changed with this particular procedure or in his back.  Emergency room provided him with naproxen and muscle relaxer.  He reports that these are not helpful very much.  That he continues to have the discomfort running into his leg.   The patient does not have symptoms concerning for COVID-19 infection (fever, chills, cough, or new shortness of breath).   Past Medical, Surgical, Social History, Allergies, and Medications have been Reviewed.  Past Medical History:  Diagnosis Date  . Anxiety   . COVID-19 04/05/2020   Past Surgical History:  Procedure Laterality Date  . BIOPSY  08/31/2019   Procedure: BIOPSY;  Surgeon: Malissa Hippo, MD;  [facility];  Service: Endoscopy;;  duodenum, gastric  . ESOPHAGOGASTRODUODENOSCOPY N/A 08/31/2019   Procedure: ESOPHAGOGASTRODUODENOSCOPY (EGD);  Surgeon: Malissa Hippo, MD;  Location: [facility];  Service: Endoscopy;  Laterality: N/A;  2:50  . knee surgery Left   . LUMBAR SPINE SURGERY     2014  Current Meds  Medication Sig  . naproxen (NAPROSYN) 500 MG tablet Take 1 tablet (500 mg total) by mouth 2 (two) times daily with a meal.     Allergies:   Patient has no known allergies.   ROS:   Please see the history of present illness.    All other systems reviewed and are negative.   Labs/Other Tests and Data Reviewed:    Recent Labs: 05/24/2019: Hemoglobin 14.4; Platelets 285 06/14/2019: BUN 16; Creat 0.86; Potassium 4.2; Sodium  141 08/03/2019: ALT 16   Recent Lipid Panel Lab Results  Component Value Date/Time   CHOL 191 05/24/2019 04:20 PM   TRIG 353 (H) 05/24/2019 04:20 PM   HDL 39 (L) 05/24/2019 04:20 PM   CHOLHDL 4.9 05/24/2019 04:20 PM   LDLCALC 105 (H) 05/24/2019 04:20 PM    Wt Readings from Last 3 Encounters:  04/16/20 168 lb (76.2 kg)  04/14/20 168 lb 14 oz (76.6 kg)  01/10/20 176 lb 4.8 oz (80 kg)     Objective:    Vital Signs:  BP 114/85   Ht 5\' 11"  (1.803 m)   Wt 168 lb (76.2 kg)   BMI 23.43 kg/m    VITAL SIGNS:  reviewed GEN:  alert and oriented  RESPIRATORY:  no shortness of breath in conversation  PSYCH:  normal affect or mood   ASSESSMENT & PLAN:    1. Encounter for examination following treatment at hospital   2. Sciatica of left side associated with disorder of lumbar spine   3. Spondylolysis, lumbar region    Time:   Today, I have spent 10 minutes with the patient with telehealth technology discussing the above problems.     Medication Adjustments/Labs and Tests Ordered: Current medicines are reviewed at length with the patient today.  Concerns regarding medicines are outlined above.   Tests Ordered: No orders of the defined types were placed in this encounter.   Medication Changes: No orders of the defined types were placed in this encounter.   Disposition:  Follow up Dec for CPE  Signed, Jan, NP  04/16/2020 11:05 AM     04/18/2020 [facility]

## 2020-05-22 ENCOUNTER — Other Ambulatory Visit: Payer: Self-pay

## 2020-05-22 ENCOUNTER — Ambulatory Visit (INDEPENDENT_AMBULATORY_CARE_PROVIDER_SITE_OTHER): Payer: BC Managed Care – PPO | Admitting: Internal Medicine

## 2020-05-22 ENCOUNTER — Encounter: Payer: Self-pay | Admitting: Internal Medicine

## 2020-05-22 VITALS — BP 129/79 | HR 85 | Temp 97.5°F | Resp 16 | Ht 72.0 in | Wt 172.8 lb

## 2020-05-22 DIAGNOSIS — M4306 Spondylolysis, lumbar region: Secondary | ICD-10-CM | POA: Diagnosis not present

## 2020-05-22 DIAGNOSIS — M5416 Radiculopathy, lumbar region: Secondary | ICD-10-CM

## 2020-05-22 DIAGNOSIS — Z23 Encounter for immunization: Secondary | ICD-10-CM

## 2020-05-22 MED ORDER — NAPROXEN 500 MG PO TABS: 500.0000 mg | ORAL_TABLET | Freq: Two times a day (BID) | ORAL | 0 refills | Status: DC

## 2020-05-22 NOTE — Progress Notes (Signed)
Acute Office Visit  Subjective:    [name], male    DOB: 13-Mar-1991, 29 y.o.   MRN: [id number]  Chief Complaint  Patient presents with  . Back Pain    back pain lower right and hip saw [name] 04-16-20 given steroids this made it better slowly he has tried otc pain relief and this hasnt helped     HPI Matthew Andrews is a 30 y.o. male with past medical history of lumbar spondylosis s/p fusion surgery of L5-S1 and GERD presents for evaluation of low back pain.  He states that he has sharp back pain in lumbar region, which is constant, variable in intensity from 3-9/10, worse with bending and squatting, better with rest and radiating to his right leg.  Of note, patient had similar symptoms 2 months ago on the left side of the back and leg, when he was given steroids and it did help him for short-term.  He cuts trees as a profession, and states that he has to move around most of the time and has to lift weight sometimes as well. He denies any recent injury. He denies any numbness, weakness or tingling in the legs at rest.  He denies any weight loss, night sweats, fever, chills, nausea, vomiting, abdominal pain, constipation, diarrhea or LE edema.    Past Medical History:  Diagnosis Date  . Anxiety   . COVID-19 04/05/2020    Past Surgical History:  Procedure Laterality Date  . BIOPSY  08/31/2019   Procedure: BIOPSY;  Surgeon: Malissa Hippo, MD;  [facility];  Service: Endoscopy;;  duodenum, gastric  . ESOPHAGOGASTRODUODENOSCOPY N/A 08/31/2019   Procedure: ESOPHAGOGASTRODUODENOSCOPY (EGD);  Surgeon: Malissa Hippo, MD;  [facility];  Service: Endoscopy;  Laterality: N/A;  2:50  . knee surgery Left   . LUMBAR SPINE SURGERY     2014    Family History  Problem Relation Age of Onset  . Cancer Father     Social History   Socioeconomic History  . Marital status: Married    Spouse name: Legrand Lasser   . Number of children: 2  . Years of  education: Not on file  . Highest education level: High school graduate  Occupational History  . Occupation: Chief Financial Officer    Comment: tree trimmer   Tobacco Use  . Smoking status: Never Smoker  . Smokeless tobacco: Never Used  Vaping Use  . Vaping Use: Never used  Substance and Sexual Activity  . Alcohol use: Yes  . Drug use: Never  . Sexual activity: Yes  Other Topics Concern  . Not on file  Social History Narrative   Lives with wife [name] married a year in half, dated for 5 years before    2 children   Daughter 4- Faith    Son 2- Engineer, maintenance       Pets: 2 dogs: American Bully: Cleo   Mutt: Dotson Mix: Pluto       Enjoys: watching football, outside, family and friends      Diet: junk food-chips, red meat, chicken, veggies and fruits, salads, was eating a lot of spicy foods, but trying to reduced   Water: 1 bottle a day   Caffiene: tea 2-3       Wears seat belt    Wears sun protection   Smoke detectors    Does not use phone while driving   Social Determinants of Corporate investment banker Strain: Low  Risk   . Difficulty of Paying Living Expenses: Not hard at all  Food Insecurity: No Food Insecurity  . Worried About Programme researcher, broadcasting/film/videounning Out of Food in the Last Year: Never true  . Ran Out of Food in the Last Year: Never true  Transportation Needs: No Transportation Needs  . Lack of Transportation (Medical): No  . Lack of Transportation (Non-Medical): No  Physical Activity: Sufficiently Active  . Days of Exercise per Week: 5 days  . Minutes of Exercise per Session: 30 min  Stress: Stress Concern Present  . Feeling of Stress : To some extent  Social Connections: Moderately Integrated  . Frequency of Communication with Friends and Family: More than three times a week  . Frequency of Social Gatherings with Friends and Family: More than three times a week  . Attends Religious Services: More than 4 times per year  . Active Member of Clubs or Organizations: No  . Attends Tax inspectorClub or  Organization Meetings: Never  . Marital Status: Married  Catering managerntimate Partner Violence: Not At Risk  . Fear of Current or Ex-Partner: No  . Emotionally Abused: No  . Physically Abused: No  . Sexually Abused: No    Outpatient Medications Prior to Visit  Medication Sig Dispense Refill  . methocarbamol (ROBAXIN) 500 MG tablet Take 1 tablet (500 mg total) by mouth 3 (three) times daily. (Patient not taking: Reported on 04/16/2020) 21 tablet 0  . naproxen (NAPROSYN) 500 MG tablet Take 1 tablet (500 mg total) by mouth 2 (two) times daily with a meal. (Patient not taking: Reported on 05/22/2020) 20 tablet 0  . predniSONE (STERAPRED UNI-PAK 21 TAB) 10 MG (21) TBPK tablet Take as directed (Patient not taking: Reported on 05/22/2020) 21 tablet 0   No facility-administered medications prior to visit.    No Known Allergies  Review of Systems  Constitutional: Negative for chills and fever.  HENT: Negative for congestion and sore throat.   Eyes: Negative for pain and discharge.  Respiratory: Negative for cough and shortness of breath.   Cardiovascular: Negative for chest pain and palpitations.  Gastrointestinal: Negative for constipation, diarrhea, nausea and vomiting.  Endocrine: Negative for polydipsia and polyuria.  Genitourinary: Negative for dysuria and hematuria.  Musculoskeletal: Positive for back pain. Negative for gait problem, neck pain and neck stiffness.  Skin: Negative for rash.  Neurological: Negative for dizziness, weakness, numbness and headaches.  Psychiatric/Behavioral: Negative for agitation and behavioral problems.        Objective:    Physical Exam Vitals reviewed.  Constitutional:      General: He is not in acute distress.    Appearance: He is not diaphoretic.  HENT:     Head: Normocephalic and atraumatic.     Nose: Nose normal.     Mouth/Throat:     Mouth: Mucous membranes are moist.  Eyes:     General: No scleral icterus.    Extraocular Movements: Extraocular  movements intact.     Pupils: Pupils are equal, round, and reactive to light.  Cardiovascular:     Rate and Rhythm: Normal rate and regular rhythm.     Pulses: Normal pulses.     Heart sounds: No murmur heard.   Pulmonary:     Breath sounds: Normal breath sounds. No wheezing or rales.  Abdominal:     Palpations: Abdomen is soft.     Tenderness: There is no abdominal tenderness.  Musculoskeletal:     Cervical back: Neck supple. No tenderness.     Lumbar  back: No swelling or tenderness. Positive right straight leg raise test. Negative left straight leg raise test.     Right lower leg: No edema.     Left lower leg: No edema.  Skin:    General: Skin is warm.     Findings: No rash.  Neurological:     General: No focal deficit present.     Mental Status: He is alert and oriented to person, place, and time.  Psychiatric:        Mood and Affect: Mood normal.        Behavior: Behavior normal.     BP 129/79 (BP Location: Right Arm, Patient Position: Sitting, Cuff Size: Normal)   Pulse 85   Temp (!) 97.5 F (36.4 C) (Temporal)   Resp 16   Ht 6' (1.829 m)   Wt 172 lb 12.8 oz (78.4 kg)   SpO2 99%   BMI 23.44 kg/m  Wt Readings from Last 3 Encounters:  05/22/20 172 lb 12.8 oz (78.4 kg)  04/16/20 168 lb (76.2 kg)  04/14/20 168 lb 14 oz (76.6 kg)    Health Maintenance Due  Topic Date Due  . Hepatitis C Screening  Never done    There are no preventive care reminders to display for this patient.   No results found for: TSH Lab Results  Component Value Date   WBC 5.1 05/24/2019   HGB 14.4 05/24/2019   HCT 42.3 05/24/2019   MCV 92.0 05/24/2019   PLT 285 05/24/2019   Lab Results  Component Value Date   NA 141 06/14/2019   K 4.2 06/14/2019   CO2 29 06/14/2019   GLUCOSE 88 06/14/2019   BUN 16 06/14/2019   CREATININE 0.86 06/14/2019   BILITOT 0.2 08/03/2019   AST 18 08/03/2019   ALT 16 08/03/2019   PROT 7.1 08/03/2019   CALCIUM 9.8 06/14/2019   Lab Results    Component Value Date   CHOL 191 05/24/2019   Lab Results  Component Value Date   HDL 39 (L) 05/24/2019   Lab Results  Component Value Date   LDLCALC 105 (H) 05/24/2019   Lab Results  Component Value Date   TRIG 353 (H) 05/24/2019   Lab Results  Component Value Date   CHOLHDL 4.9 05/24/2019   No results found for: HGBA1C     Assessment & Plan:   Acute lumbar radiculopathy Right sided back pain, radiating to the buttock and right leg SLR positive on right side Naproxen as needed for pain Had steroid in the previous episode with short-term relief Advised simple back exercises, material provided Referral to Spine Surgery as patient has h/o L5-S1 fusion surgery and in the setting of recurrent symptoms Advised to avoid lifting heavy weights and excessive back movements  GERD On Omeprazole Advised to avoid spicy food and continue Omeprazole Avoid long-term NSAID use   Problem List Items Addressed This Visit      Musculoskeletal and Integument   Spondylolysis, lumbar region   Relevant Medications   naproxen (NAPROSYN) 500 MG tablet   Other Relevant Orders   Ambulatory referral to Spine Surgery    Other Visit Diagnoses    Lumbar radiculopathy    -  Primary   Relevant Orders   Ambulatory referral to Spine Surgery   Need for immunization against influenza       Relevant Orders   Flu Vaccine QUAD 36+ mos IM (Completed)       Meds ordered this encounter  Medications  .  naproxen (NAPROSYN) 500 MG tablet    Sig: Take 1 tablet (500 mg total) by mouth 2 (two) times daily with a meal.    Dispense:  20 tablet    Refill:  0     [place] Concha Se, MD

## 2020-05-22 NOTE — Patient Instructions (Addendum)
You are being referred to Spine Surgery for evaluation of lumbar radiculopathy.  Meanwhile, please avoid lifting heavy weights or excessive back movements.  Please follow simple back exercises as discussed. [email or web address]  Okay to take Naproxen up to twice daily for pain/swelling.  Radicular Pain Radicular pain is a type of pain that spreads from your back or neck along a spinal nerve. Spinal nerves are nerves that leave the spinal cord and go to the muscles. Radicular pain is sometimes called radiculopathy, radiculitis, or a pinched nerve. When you have this type of pain, you may also have weakness, numbness, or tingling in the area of your body that is supplied by the nerve. The pain may feel sharp and burning. Depending on which spinal nerve is affected, the pain may occur in the:  Neck area (cervical radicular pain). You may also feel pain, numbness, weakness, or tingling in the arms.  Mid-spine area (thoracic radicular pain). You would feel this pain in the back and chest. This type is rare.  Lower back area (lumbar radicular pain). You would feel this pain as low back pain. You may feel pain, numbness, weakness, or tingling in the buttocks or legs. Sciatica is a type of lumbar radicular pain that shoots down the back of the leg. Radicular pain occurs when one of the spinal nerves becomes irritated or squeezed (compressed). It is often caused by something pushing on a spinal nerve, such as one of the bones of the spine (vertebrae) or one of the round cushions between vertebrae (intervertebral disks). This can result from:  An injury.  Wear and tear or aging of a disk.  The growth of a bone spur that pushes on the nerve. Radicular pain often goes away when you follow instructions from your health care provider for relieving pain at home. Follow these instructions at home: Managing pain      If directed, put ice on the affected  area: ? Put ice in a plastic bag. ? Place a towel between your skin and the bag. ? Leave the ice on for 20 minutes, 2-3 times a day.  If directed, apply heat to the affected area as often as told by your health care provider. Use the heat source that your health care provider recommends, such as a moist heat pack or a heating pad. ? Place a towel between your skin and the heat source. ? Leave the heat on for 20-30 minutes. ? Remove the heat if your skin turns bright red. This is especially important if you are unable to feel pain, heat, or cold. You may have a greater risk of getting burned. Activity   Do not sit or rest in bed for long periods of time.  Try to stay as active as possible. Ask your health care provider what type of exercise or activity is best for you.  Avoid activities that make your pain worse, such as bending and lifting.  Do not lift anything that is heavier than 10 lb (4.5 kg), or the limit that you are told, until your health care provider says that it is safe.  Practice using proper technique when lifting items. Proper lifting technique involves bending your knees and rising up.  Do strength and range-of-motion exercises only as told by your health care provider or physical therapist. General instructions  Take over-the-counter and prescription medicines only as told by your health care provider.  Pay attention to any changes in your symptoms.  Keep all follow-up visits as told by  your health care provider. This is important. ? Your health care provider may send you to a physical therapist to help with this pain. Contact a health care provider if:  Your pain and other symptoms get worse.  Your pain medicine is not helping.  Your pain has not improved after a few weeks of home care.  You have a fever. Get help right away if:  You have severe pain, weakness, or numbness.  You have difficulty with bladder or bowel control. Summary  Radicular pain is  a type of pain that spreads from your back or neck along a spinal nerve.  When you have radicular pain, you may also have weakness, numbness, or tingling in the area of your body that is supplied by the nerve.  The pain may feel sharp or burning.  Radicular pain may be treated with ice, heat, medicines, or physical therapy. This information is not intended to replace advice given to you by your health care provider. Make sure you discuss any questions you have with your health care provider. Document Revised: 02/15/2018 Document Reviewed: 02/15/2018 Elsevier Patient Education  2020 [employer].

## 2020-06-05 ENCOUNTER — Telehealth: Payer: Self-pay

## 2020-06-05 NOTE — Telephone Encounter (Signed)
Pt called stating he has not heard anything about his referral to [facility]. Can you check on this please.

## 2020-06-05 NOTE — Telephone Encounter (Signed)
New referral sent to [facility] neurosurgery and spine dr Danielle Dess

## 2020-07-03 ENCOUNTER — Encounter: Payer: Self-pay | Admitting: Nurse Practitioner

## 2020-07-03 ENCOUNTER — Other Ambulatory Visit: Payer: Self-pay

## 2020-07-03 ENCOUNTER — Ambulatory Visit: Payer: BC Managed Care – PPO | Admitting: Nurse Practitioner

## 2020-07-03 VITALS — BP 133/78 | HR 85 | Temp 98.6°F | Resp 18 | Ht 72.0 in | Wt 174.0 lb

## 2020-07-03 DIAGNOSIS — R300 Dysuria: Secondary | ICD-10-CM | POA: Diagnosis not present

## 2020-07-03 LAB — POCT URINALYSIS DIPSTICK
Bilirubin, UA: NEGATIVE
Blood, UA: NEGATIVE
Glucose, UA: NEGATIVE
Ketones, UA: NEGATIVE
Leukocytes, UA: NEGATIVE
Nitrite, UA: NEGATIVE
Protein, UA: NEGATIVE
Spec Grav, UA: 1.015 (ref 1.010–1.025)
Urobilinogen, UA: NEGATIVE E.U./dL — AB
pH, UA: 6 (ref 5.0–8.0)

## 2020-07-03 NOTE — Progress Notes (Signed)
Acute Office Visit  Subjective:    [name], male    DOB: 1991-07-15, 29 y.o.   MRN: [id number]  Chief Complaint  Patient presents with  . Dysuria    itching and irritation     HPI Patient is in today for irritation of his urinary tract. He used a new lubricant Saturday night, and Sunday morning he woke up with dysuria.  He has been taking AZO, and that has helped with his dysuria.  He states he still feels itchy inside his urinary tract.  He states this is getting better.  His last dose of AZO was last night. Denies rash or discharge.  Past Medical History:  Diagnosis Date  . Anxiety   . COVID-19 04/05/2020    Past Surgical History:  Procedure Laterality Date  . BIOPSY  08/31/2019   Procedure: BIOPSY;  Surgeon: Rehman, Najeeb U, MD;  Location: [facility];  Service: Endoscopy;;  duodenum, gastric  . ESOPHAGOGASTRODUODENOSCOPY N/A 08/31/2019   Procedure: ESOPHAGOGASTRODUODENOSCOPY (EGD);  Surgeon: Rehman, Najeeb U, MD;  Location: [facility];  Service: Endoscopy;  Laterality: N/A;  2:50  . knee surgery Left   . LUMBAR SPINE SURGERY     20 14    Family History  Problem Relation Age of Onset  . Cancer Father     Social History   Socioeconomic History  . Marital status: Married    Spouse name: Lyn Deemer   . Number of children: 2  . Years of education: Not on file  . Highest education level: High school graduate  Occupational History  . Occupation: [employer]    Comment: tree trimmer   Tobacco Use  . Smoking status: Never Smoker  . Smokeless tobacco: Never Used  Vaping Use  . Vaping Use: Never used  Substance and Sexual Activity  . Alcohol use: Yes  . Drug use: Never  . Sexual activity: Yes  Other Topics Concern  . Not on file  Social History Narrative   Lives with wife Chief Financial Officer married a year in half, dated for 5 years before    2 children   Daughter 4- Faith    Son 2- [name]       Pets: 2 dogs: American Bully: Cleo   Mutt:  Dotson Mix: Pluto       Enjoys: watching football, outside, family and friends      Diet: junk food-chips, red meat, chicken, veggies and fruits, salads, was eating a lot of spicy foods, but trying to reduced   Water: 1 bottle a day   Caffiene: tea 2-3       Wears seat belt    Wears sun protection   Smoke detectors    Does not use phone while driving   Social Determinants of Health   Financial Resource Strain:   . Difficulty of Paying Living Expenses: Not on file  Food Insecurity:   . Worried About Engineer, maintenance in the Last Year: Not on file  . Ran Out of Food in the Last Year: Not on file  Transportation Needs:   . Lack of Transportation (Medical): Not on file  . Lack of Transportation (Non-Medical): Not on file  Physical Activity:   . Days of Exercise per Week: Not on file  . Minutes of Exercise per Session: Not on file  Stress:   . Feeling of Stress : Not on file  Social Connections:   . Frequency of Communication with Friends and Family: Not  on file  . Frequency of Social Gatherings with Friends and Family: Not on file  . Attends Religious Services: Not on file  . Active Member of Clubs or Organizations: Not on file  . Attends Banker Meetings: Not on file  . Marital Status: Not on file  Intimate Partner Violence:   . Fear of Current or Ex-Partner: Not on file  . Emotionally Abused: Not on file  . Physically Abused: Not on file  . Sexually Abused: Not on file    Outpatient Medications Prior to Visit  Medication Sig Dispense Refill  . naproxen (NAPROSYN) 500 MG tablet Take 1 tablet (500 mg total) by mouth 2 (two) times daily with a meal. (Patient not taking: Reported on 07/03/2020) 20 tablet 0   No facility-administered medications prior to visit.    No Known Allergies  Review of Systems  Constitutional: Negative for chills and fever.  Genitourinary: Positive for dysuria. Negative for discharge, flank pain, hematuria, penile pain, penile  swelling, scrotal swelling, testicular pain and urgency.       Dysuria resolved 2 days ago       Objective:    Physical Exam Constitutional:      Appearance: Normal appearance.  Genitourinary:    Comments: Genital exam deferred because patient states that he has no rash, swelling, or external issues with penis. Neurological:     Mental Status: He is alert.     BP 133/78   Pulse 85   Temp 98.6 F (37 C)   Resp 18   Ht 6' (1.829 m)   Wt 174 lb (78.9 kg)   SpO2 98%   BMI 23.60 kg/m  Wt Readings from Last 3 Encounters:  07/03/20 174 lb (78.9 kg)  05/22/20 172 lb 12.8 oz (78.4 kg)  04/16/20 168 lb (76.2 kg)    Health Maintenance Due  Topic Date Due  . Hepatitis C Screening  Never done  . COVID-19 Vaccine (1) Never done    There are no preventive care reminders to display for this patient.   No results found for: TSH Lab Results  Component Value Date   WBC 5.1 05/24/2019   HGB 14.4 05/24/2019   HCT 42.3 05/24/2019   MCV 92.0 05/24/2019   PLT 285 05/24/2019   Lab Results  Component Value Date   NA 141 06/14/2019   K 4.2 06/14/2019   CO2 29 06/14/2019   GLUCOSE 88 06/14/2019   BUN 16 06/14/2019   CREATININE 0.86 06/14/2019   BILITOT 0.2 08/03/2019   AST 18 08/03/2019   ALT 16 08/03/2019   PROT 7.1 08/03/2019   CALCIUM 9.8 06/14/2019   Lab Results  Component Value Date   CHOL 191 05/24/2019   Lab Results  Component Value Date   HDL 39 (L) 05/24/2019   Lab Results  Component Value Date   LDLCALC 105 (H) 05/24/2019   Lab Results  Component Value Date   TRIG 353 (H) 05/24/2019   Lab Results  Component Value Date   CHOLHDL 4.9 05/24/2019   No results found for: HGBA1C     Assessment & Plan:   Problem List Items Addressed This Visit    None    Visit Diagnoses    Dysuria    -  Primary     Dysuria -started the day after switching lubricants -likely from chemical irritation -his dysuria has improved, but he still has an internal  itching sensation that is mild and improving -checked U/A today since he  had dysuria; was negative for UTI -he has been taking AZO, and that may taint the UA results -we discussed increasing hydration to increase urine quantity to aid in removal of any irritant from his urinary tract   No orders of the defined types were placed in this encounter.    Heather Roberts, NP

## 2020-07-03 NOTE — Patient Instructions (Addendum)
Dysuria -started the day after switching lubricants -likely from chemical irritation -his dysuria has improved, but he still has an internal itching sensation that is mild and improving -checked U/A today since he had dysuria; was negative for urinary tract infection -we discussed increasing hydration to increase urine quantity to aid in removal of any irritant from his urinary tract

## 2020-07-04 ENCOUNTER — Ambulatory Visit
Admission: EM | Admit: 2020-07-04 | Discharge: 2020-07-04 | Disposition: A | Discharge status: Home / Self Care | Payer: BC Managed Care – PPO | Attending: Emergency Medicine | Admitting: Emergency Medicine

## 2020-07-04 ENCOUNTER — Other Ambulatory Visit: Payer: Self-pay

## 2020-07-04 DIAGNOSIS — H66001 Acute suppurative otitis media without spontaneous rupture of ear drum, right ear: Secondary | ICD-10-CM | POA: Diagnosis not present

## 2020-07-04 MED ORDER — AMOXICILLIN-POT CLAVULANATE 875-125 MG PO TABS
1.0000 | ORAL_TABLET | Freq: Two times a day (BID) | ORAL | 0 refills | Status: AC
Start: 2020-07-04 — End: 2020-07-14

## 2020-07-04 NOTE — Discharge Instructions (Signed)
Rest and drink plenty of fluids Prescribed augmentin Take medications as directed and to completion Continue to use OTC ibuprofen and/ or tylenol as needed for pain control Follow up with PCP if symptoms persists Return here or go to the ER if you have any new or worsening symptoms fever, chills, nausea, vomiting, redness, swelling, drainage, etc... 

## 2020-07-04 NOTE — ED Provider Notes (Signed)
[facility]   [id number] 07/04/20 Arrival Time: 1623  CC: EAR PAIN  SUBJECTIVE: History from: patient.  FARAH BENISH is a 29 y.o. male who presents with of RT ear pain x 1 week.  Denies a precipitating event, such as swimming or wearing ear plugs.  Patient states the pain is constant and achy in character.  Patient has tried OTC ear drops without relief.  Symptoms are made worse with lying down.  Reports similar symptoms in the past with ear infection.  Reports associated ringing in ears, and feeling like they need to pop.    Denies fever, chills, sinus pain, rhinorrhea, ear discharge, sore throat, SOB, wheezing, chest pain, nausea, changes in bowel or bladder habits.    ROS: As per HPI.  All other pertinent ROS negative.     Past Medical History:  Diagnosis Date  . Anxiety   . COVID-19 04/05/2020   Past Surgical History:  Procedure Laterality Date  . BIOPSY  08/31/2019   Procedure: BIOPSY;  Surgeon: Malissa Hippo, MD;  Location: [facility];  Service: Endoscopy;;  duodenum, gastric  . ESOPHAGOGASTRODUODENOSCOPY N/A 08/31/2019   Procedure: ESOPHAGOGASTRODUODENOSCOPY (EGD);  Surgeon: Malissa Hippo, MD;  Location: [facility];  Service: Endoscopy;  Laterality: N/A;  2:50  . knee surgery Left   . LUMBAR SPINE SURGERY     2014   No Known Allergies No current facility-administered medications on file prior to encounter.   Current Outpatient Medications on File Prior to Encounter  Medication Sig Dispense Refill  . naproxen (NAPROSYN) 500 MG tablet Take 1 tablet (500 mg total) by mouth 2 (two) times daily with a meal. (Patient not taking: Reported on 07/03/2020) 20 tablet 0   Social History   Socioeconomic History  . Marital status: Married    Spouse name: Ayeden Gladman   . Number of children: 2  . Years of education: Not on file  . Highest education level: High school graduate  Occupational History  . Occupation: Chief Financial Officer    Comment: tree trimmer    Tobacco Use  . Smoking status: Never Smoker  . Smokeless tobacco: Never Used  Vaping Use  . Vaping Use: Never used  Substance and Sexual Activity  . Alcohol use: Yes  . Drug use: Never  . Sexual activity: Yes  Other Topics Concern  . Not on file  Social History Narrative   Lives with wife [name] married a year in half, dated for 5 years before    2 children   Daughter 4- Faith    Son 2- Engineer, maintenance       Pets: 2 dogs: American Bully: Cleo   Mutt: Dotson Mix: Pluto       Enjoys: watching football, outside, family and friends      Diet: junk food-chips, red meat, chicken, veggies and fruits, salads, was eating a lot of spicy foods, but trying to reduced   Water: 1 bottle a day   Caffiene: tea 2-3       Wears seat belt    Wears sun protection   Smoke detectors    Does not use phone while driving   Social Determinants of Health   Financial Resource Strain:   . Difficulty of Paying Living Expenses: Not on file  Food Insecurity:   . Worried About Programme researcher, broadcasting/film/video in the Last Year: Not on file  . Ran Out of Food in the Last Year: Not on file  Transportation Needs:   .  Lack of Transportation (Medical): Not on file  . Lack of Transportation (Non-Medical): Not on file  Physical Activity:   . Days of Exercise per Week: Not on file  . Minutes of Exercise per Session: Not on file  Stress:   . Feeling of Stress : Not on file  Social Connections:   . Frequency of Communication with Friends and Family: Not on file  . Frequency of Social Gatherings with Friends and Family: Not on file  . Attends Religious Services: Not on file  . Active Member of Clubs or Organizations: Not on file  . Attends Banker Meetings: Not on file  . Marital Status: Not on file  Intimate Partner Violence:   . Fear of Current or Ex-Partner: Not on file  . Emotionally Abused: Not on file  . Physically Abused: Not on file  . Sexually Abused: Not on file   Family History  Problem Relation  Age of Onset  . Cancer Father     OBJECTIVE:  Vitals:   07/04/20 1640  BP: 120/63  Pulse: 94  Resp: 18  Temp: 97.7 F (36.5 C)  SpO2: 93%     General appearance: alert; mildly fatigued appearing, nontoxic; speaking in full sentences and tolerating own secretions HEENT: NCAT; Ears: EACs clear, bilateral TMs with yellow purulent drainage and tense, R>L; Eyes: PERRL.  EOM grossly intact.Nose: nares patent without rhinorrhea, Throat: oropharynx clear, tonsils non erythematous or enlarged, uvula midline  Neck: supple without LAD Lungs: unlabored respirations, symmetrical air entry; cough: absent; no respiratory distress; CTAB Heart: regular rate and rhythm.  Skin: warm and dry Psychological: alert and cooperative; normal mood and affect    ASSESSMENT & PLAN:  1. Non-recurrent acute suppurative otitis media of right ear without spontaneous rupture of tympanic membrane     Meds ordered this encounter  Medications  . amoxicillin-clavulanate (AUGMENTIN) 875-125 MG tablet    Sig: Take 1 tablet by mouth every 12 (twelve) hours for 10 days.    Dispense:  20 tablet    Refill:  0    Order Specific Question:   Supervising Provider    Answer:   Eustace Moore [[id number]]   Rest and drink plenty of fluids Prescribed augmentin  Take medications as directed and to completion Continue to use OTC ibuprofen and/ or tylenol as needed for pain control Follow up with PCP if symptoms persists Return here or go to the ER if you have any new or worsening symptoms fever, chills, nausea, vomiting, redness, swelling, drainage, etc...  Reviewed expectations re: course of current medical issues. Questions answered. Outlined signs and symptoms indicating need for more acute intervention. Patient verbalized understanding. After Visit Summary given.         Rennis Harding, PA-C 07/04/20 1701

## 2020-07-04 NOTE — ED Triage Notes (Signed)
Pt presents with c/o right ear pain and ringing, began almost a week ago became worse today with also feeling of being unwell.

## 2020-07-22 ENCOUNTER — Other Ambulatory Visit: Payer: Self-pay

## 2020-07-22 ENCOUNTER — Ambulatory Visit
Admission: EM | Admit: 2020-07-22 | Discharge: 2020-07-22 | Disposition: A | Discharge status: Home / Self Care | Payer: BC Managed Care – PPO

## 2020-07-22 ENCOUNTER — Encounter: Payer: Self-pay | Admitting: Emergency Medicine

## 2020-07-22 DIAGNOSIS — J029 Acute pharyngitis, unspecified: Secondary | ICD-10-CM | POA: Diagnosis not present

## 2020-07-22 DIAGNOSIS — I889 Nonspecific lymphadenitis, unspecified: Secondary | ICD-10-CM

## 2020-07-22 DIAGNOSIS — Z20828 Contact with and (suspected) exposure to other viral communicable diseases: Secondary | ICD-10-CM | POA: Diagnosis not present

## 2020-07-22 MED ORDER — PREDNISONE 20 MG PO TABS
40.0000 mg | ORAL_TABLET | Freq: Every day | ORAL | 0 refills | Status: DC
Start: 2020-07-22 — End: 2020-08-02

## 2020-07-22 MED ORDER — FAMOTIDINE 20 MG PO TABS
20.0000 mg | ORAL_TABLET | Freq: Two times a day (BID) | ORAL | 0 refills | Status: DC
Start: 2020-07-22 — End: 2021-02-03

## 2020-07-22 NOTE — ED Triage Notes (Addendum)
For 2 weeks has noticed a sore throat in the evening, very painful, but earlier in the day, throat seems to be fine.  Over the past few days, sore throat is noticeable earlier in the day.  Chest sore with coughing, minimal productive cough.  Patient has had a slight runny nose.  Patient denies fever  patient says he is taking amoxicillin.  Says he was seen here recently and prescribed amoxicillin and "skipped a day or two" of taking medicine

## 2020-07-22 NOTE — ED Provider Notes (Signed)
[facility]    CSN: [id number] Arrival date & time: 07/22/20  4742      History   Chief Complaint Chief Complaint  Patient presents with  . Sore Throat    HPI Matthew Andrews is a 29 y.o. male.   HPI  Patient presents today for evaluation of approximately 2 to 3 weeks of right soreness.  He was treated mid November for an acute ear infection with Augmentin.  He reports taking only 1 amoxicillin daily for 10 days during the course of treatment as he misread the instructions of the medication.  He had leftover amoxicillin since his throat pain worsened he started taking amoxicillin a few days ago.  Throat pain has not changed and he has periods of hoarseness along with dysphagia.  He endorses painful lymph nodes behind his ear and under his neck.  He has not had fever.  He has a history of Covid approximately 3 months ago.  He is not had any sick contacts.  No known history of recurrent strep.  Past Medical History:  Diagnosis Date  . Anxiety   . COVID-19 04/05/2020    Patient Active Problem List   Diagnosis Date Noted  . Sciatica of left side associated with disorder of lumbar spine 04/16/2020  . Encounter for examination following treatment at hospital 04/16/2020  . RUQ pain 08/03/2019  . Gastroesophageal reflux disease 06/08/2019  . Cervical spondylosis without myelopathy 04/26/2017  . 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 BIOPSY  08/31/2019   Procedure: BIOPSY;  Surgeon: Malissa Hippo, MD;  Location: [facility];  Service: Endoscopy;;  duodenum, gastric  . ESOPHAGOGASTRODUODENOSCOPY N/A 08/31/2019   Procedure: ESOPHAGOGASTRODUODENOSCOPY (EGD);  Surgeon: Malissa Hippo, MD;  Location: [facility];  Service: Endoscopy;  Laterality: N/A;  2:50  . knee surgery Left   . LUMBAR SPINE SURGERY     2014       Home Medications    Prior to Admission medications   Medication Sig Start Date End  Date Taking? Authorizing Provider  AMOXICILLIN PO Take by mouth. Patient has not taken as prescribed, "skipped a day or two"   Yes [provider]  famotidine (PEPCID) 20 MG tablet Take 1 tablet (20 mg total) by mouth 2 (two) times daily. 07/22/20   Bing Neighbors, FNP  naproxen (NAPROSYN) 500 MG tablet Take 1 tablet (500 mg total) by mouth 2 (two) times daily with a meal. Patient not taking: Reported on 07/03/2020 05/22/20   Anabel Halon, MD  predniSONE (DELTASONE) 20 MG tablet Take 2 tablets (40 mg total) by mouth daily with breakfast. 07/22/20   Bing Neighbors, FNP    Family History Family History  Problem Relation Age of Onset  . Cancer Father     Social History Social History   Tobacco Use  . Smoking status: Never Smoker  . Smokeless tobacco: Never Used  Vaping Use  . Vaping Use: Never used  Substance Use Topics  . Alcohol use: Yes  . Drug use: Never     Allergies   Patient has no known allergies.   Review of Systems Review of Systems Pertinent negatives listed in HPI   Physical Exam Triage Vital Signs ED Triage Vitals  Enc Vitals Group     BP 07/22/20 0913 (!) 145/80     Pulse Rate 07/22/20 0913 92     Resp 07/22/20 0913 16  Temp 07/22/20 0913 98.4 F (36.9 C)     Temp Source 07/22/20 0913 Oral     SpO2 07/22/20 0913 97 %     Weight --      Height --      Head Circumference --      Peak Flow --      Pain Score 07/22/20 0930 5     Pain Loc --      Pain Edu? --      Excl. in GC? --    No data found.  Updated Vital Signs BP (!) 145/80 (BP Location: Right Arm)   Pulse 92   Temp 98.4 F (36.9 C) (Oral)   Resp 16   SpO2 97%   Visual Acuity Right Eye Distance:   Left Eye Distance:   Bilateral Distance:    Right Eye Near:   Left Eye Near:    Bilateral Near:     Physical Exam Constitutional:      Appearance: He is well-developed. He is not ill-appearing.  HENT:     Mouth/Throat:     Mouth: Mucous membranes are moist.      Pharynx: Pharyngeal swelling present. No oropharyngeal exudate, posterior oropharyngeal erythema or uvula swelling.     Tonsils: 2+ on the right. 2+ on the left.  Eyes:     Extraocular Movements:     Right eye: Normal extraocular motion.     Left eye: Normal extraocular motion.     Conjunctiva/sclera: Conjunctivae normal.     Pupils: Pupils are equal, round, and reactive to light.  Cardiovascular:     Rate and Rhythm: Normal rate and regular rhythm.  Pulmonary:     Effort: Pulmonary effort is normal.     Breath sounds: Normal breath sounds.  Musculoskeletal:     Cervical back: Normal range of motion.  Skin:    General: Skin is warm and dry.     Capillary Refill: Capillary refill takes less than 2 seconds.  Neurological:     General: No focal deficit present.     Mental Status: He is alert.      UC Treatments / Results  Labs (all labs ordered are listed, but only abnormal results are displayed) Labs Reviewed  COVID-19, FLU A+B AND RSV    EKG   Radiology No results found.  Procedures Procedures (including critical care time)  Medications Ordered in UC Medications - No data to display  Initial Impression / Assessment and Plan / UC Course  I have reviewed the triage vital signs and the nursing notes.  Pertinent labs & imaging results that were available during my care of the patient were reviewed by me and considered in my medical decision making (see chart for details).    Differentials include viral pharyngitis versus GERD given oropharyngeal edema and cervical lymph node tenderness we will treat for inflammation of the throat with prednisone along with delayed treatment for symptoms of GERD with famotidine 20 mg twice daily as needed.  Advised to continue the famotidine in concert with the prednisone and discontinue the famotidine and take as needed.  Red flags discussed that warrant immediate follow-up at the ER.  Overall appearance of throat is not consistent with  a bacterial infection.  Therefore advised to discontinue taking amoxicillin.  Patient verbalized understanding and agreement with plan.  Respiratory panel obtained and pending given current pandemic. Final Clinical Impressions(s) / UC Diagnoses   Final diagnoses:  Acute pharyngitis, unspecified etiology  Adenitis   Discharge  Instructions   None    ED Prescriptions    Medication Sig Dispense Auth. Provider   predniSONE (DELTASONE) 20 MG tablet Take 2 tablets (40 mg total) by mouth daily with breakfast. 10 tablet Bing Neighbors, FNP   famotidine (PEPCID) 20 MG tablet Take 1 tablet (20 mg total) by mouth 2 (two) times daily. 30 tablet Bing Neighbors, FNP     PDMP not reviewed this encounter.   Bing Neighbors, FNP 07/22/20 1036

## 2020-07-25 LAB — COVID-19, FLU A+B AND RSV
Influenza A, NAA: NOT DETECTED
Influenza B, NAA: NOT DETECTED
RSV, NAA: NOT DETECTED
SARS-CoV-2, NAA: NOT DETECTED

## 2020-07-26 DIAGNOSIS — Z981 Arthrodesis status: Secondary | ICD-10-CM | POA: Diagnosis not present

## 2020-07-26 DIAGNOSIS — R030 Elevated blood-pressure reading, without diagnosis of hypertension: Secondary | ICD-10-CM | POA: Diagnosis not present

## 2020-08-02 ENCOUNTER — Ambulatory Visit (INDEPENDENT_AMBULATORY_CARE_PROVIDER_SITE_OTHER): Payer: BC Managed Care – PPO | Admitting: Family Medicine

## 2020-08-02 ENCOUNTER — Other Ambulatory Visit: Payer: Self-pay

## 2020-08-02 ENCOUNTER — Encounter: Payer: Self-pay | Admitting: Family Medicine

## 2020-08-02 VITALS — BP 118/68 | HR 68 | Temp 98.0°F | Ht 72.0 in | Wt 178.0 lb

## 2020-08-02 DIAGNOSIS — Z808 Family history of malignant neoplasm of other organs or systems: Secondary | ICD-10-CM

## 2020-08-02 DIAGNOSIS — J029 Acute pharyngitis, unspecified: Secondary | ICD-10-CM

## 2020-08-02 DIAGNOSIS — K219 Gastro-esophageal reflux disease without esophagitis: Secondary | ICD-10-CM | POA: Diagnosis not present

## 2020-08-02 DIAGNOSIS — L989 Disorder of the skin and subcutaneous tissue, unspecified: Secondary | ICD-10-CM | POA: Diagnosis not present

## 2020-08-02 DIAGNOSIS — Z0001 Encounter for general adult medical examination with abnormal findings: Secondary | ICD-10-CM | POA: Diagnosis not present

## 2020-08-02 DIAGNOSIS — M4306 Spondylolysis, lumbar region: Secondary | ICD-10-CM

## 2020-08-02 NOTE — Progress Notes (Signed)
Health Maintenance reviewed -  Immunization History  Administered Date(s) Administered  . Influenza,inj,Quad PF,6+ Mos 05/24/2019, 05/22/2020   Last colonoscopy: n/a Last PSA: n/a Dentist: Twice yearly Ophtho: not needed Exercise: limited exercise- mostly with work  Smoker: no  Alcohol Use: limited   Other doctors caring for patient include:  Patient Care Team: Freddy Finner, NP as PCP - General (Family Medicine)  End of Life Discussion:  Patient does not have a living will and medical power of attorney  Subjective:   HPI  Matthew Andrews is a 29 y.o. male who presents for annual visit and follow-up on chronic medical conditions.  He has the following concerns: his nose as an area that is tender at times and red continuously. Both parents have had skin cancer. He himself has never had skin cancer. He reports that his father's side of the family seems to keep a "red nose" but that it did not seem to be bothersome until recently. Denies injury or trauma to the area. Only discoloration is red/pink. The main area of concern seems to have a little central area that is different and raised a little.  He also reports ongoing throat discomfort nightly- improved on prednisone recently due to ear infection, but it returned after the prednisone ended. He reports in conversation that he does clear his throat a lot. He was on allergy meds but stopped them earlier this year. He was on pepcid from UC, but reports stopping it due to not thinking it was helping much.He has never had a sinus issue, but reports tubes in ears when little and was told scaring was bad. He notes mild hearing changes, that seem to worsen as he is getting older. He does not have any pain today in the ears. And his throat pain as stated is usually only at night.  Review Of Systems  Review of Systems  Constitutional: Negative.   HENT: Positive for sore throat.   Eyes: Negative.   Respiratory: Negative.    Cardiovascular: Negative.   Gastrointestinal: Negative.   Endocrine: Negative.   Genitourinary: Negative.   Musculoskeletal: Negative.   Skin: Negative.        Nose changes   Neurological: Negative.   Psychiatric/Behavioral: Negative.     Objective:   PHYSICAL EXAM:  BP 118/68 (BP Location: Right Arm, Patient Position: Sitting, Cuff Size: Normal)   Pulse 68   Temp 98 F (36.7 C) (Temporal)   Ht 6' (1.829 m)   Wt 178 lb (80.7 kg)   SpO2 98%   BMI 24.14 kg/m    Physical Exam Vitals and nursing note reviewed.  Constitutional:      General: He is awake.     Appearance: Normal appearance. He is well-developed, well-groomed and normal weight.  HENT:     Head: Normocephalic and atraumatic.     Right Ear: Hearing, ear canal and external ear normal. Tympanic membrane is scarred.     Left Ear: Hearing, ear canal and external ear normal. Tympanic membrane is scarred.     Ears:     Comments: Mild cerumen noted bilaterally     Nose: Nose normal.     Comments: Redness of the nose, noted slightly raised-more pink than red and a central area.    Mouth/Throat:     Mouth: Mucous membranes are moist.     Pharynx: Oropharynx is clear.  Eyes:     General: Lids are normal.        Right eye:  No discharge.        Left eye: No discharge.     Extraocular Movements: Extraocular movements intact.     Conjunctiva/sclera: Conjunctivae normal.     Pupils: Pupils are equal, round, and reactive to light.  Neck:     Thyroid: No thyroid mass, thyromegaly or thyroid tenderness.     Vascular: No carotid bruit.  Cardiovascular:     Rate and Rhythm: Normal rate and regular rhythm.     Pulses: Normal pulses.     Heart sounds: Normal heart sounds.  Pulmonary:     Effort: Pulmonary effort is normal.     Breath sounds: Normal breath sounds.  Abdominal:     General: Bowel sounds are normal. There is no distension.     Palpations: Abdomen is soft.     Tenderness: There is no abdominal tenderness.  There is no right CVA tenderness or left CVA tenderness.     Hernia: No hernia is present.  Musculoskeletal:        General: Normal range of motion.     Cervical back: Full passive range of motion without pain, normal range of motion and neck supple.     Right lower leg: No edema.     Left lower leg: No edema.     Comments: MAE, ROM intact   Lymphadenopathy:     Cervical: No cervical adenopathy.  Skin:    General: Skin is warm and dry.     Capillary Refill: Capillary refill takes less than 2 seconds.  Neurological:     General: No focal deficit present.     Mental Status: He is alert and oriented to person, place, and time.     Cranial Nerves: Cranial nerves are intact.     Sensory: Sensation is intact.     Motor: Motor function is intact.     Coordination: Coordination is intact.     Gait: Gait is intact.     Deep Tendon Reflexes: Reflexes are normal and symmetric.  Psychiatric:        Attention and Perception: Attention and perception normal.        Mood and Affect: Mood and affect normal.        Speech: Speech normal.        Behavior: Behavior normal. Behavior is cooperative.        Thought Content: Thought content normal.        Cognition and Memory: Cognition and memory normal.        Judgment: Judgment normal.      Depression Screening  Depression screen [facility] 2/9 08/02/2020 07/03/2020 05/22/2020 04/16/2020 07/24/2019  Decreased Interest 0 0 0 0 0  Down, Depressed, Hopeless 0 0 0 0 1  PHQ - 2 Score 0 0 0 0 1  Altered sleeping 1 1 - - -  Tired, decreased energy 0 1 - - -  Change in appetite 0 0 - - -  Feeling bad or failure about yourself  0 0 - - -  Trouble concentrating 0 0 - - -  Moving slowly or fidgety/restless 0 0 - - -  Suicidal thoughts 0 0 - - -  PHQ-9 Score 1 2 - - -     Falls  Fall Risk  08/02/2020 07/03/2020 05/22/2020 04/16/2020 07/24/2019  Falls in the past year? 0 0 0 0 0  Number falls in past yr: 0 0 0 0 0  Injury with Fall? 0 0 0 0 0  Risk for  fall  due to : No Fall Risks No Fall Risks No Fall Risks No Fall Risks -  Follow up Falls evaluation completed Falls evaluation completed Falls evaluation completed Falls evaluation completed -    Assessment & Plan:   1. Annual visit for general adult medical examination with abnormal findings   2. Family history of skin cancer   3. Lesion of skin of nose   4. Gastroesophageal reflux disease, unspecified whether esophagitis present   5. Spondylolysis, lumbar region   6. Sore throat     Tests ordered Orders Placed This Encounter  Procedures  . CBC  . Comprehensive metabolic panel  . Hemoglobin A1c  . Lipid panel  . TSH  . Ambulatory referral to Dermatology     Plan: Please see assessment and plan per problem list above.   No orders of the defined types were placed in this encounter.   I have personally reviewed: The patient's medical and social history Their use of alcohol, tobacco or illicit drugs Their current medications and supplements The patient's functional ability including ADLs,fall risks, home safety risks, cognitive, and hearing and visual impairment Diet and physical activities Evidence for depression or mood disorders  The patient's weight, height, BMI, and visual acuity have been recorded in the chart.  I have made referrals, counseling, and provided education to the patient based on review of the above and I have provided the patient with a written personalized care plan for preventive services.     Freddy Finner, NP   08/02/2020

## 2020-08-02 NOTE — Assessment & Plan Note (Signed)
Discussed PSA screening (risks/benefits), recommended at least 30 minutes of aerobic activity at least 5 days/week; proper sunscreen use reviewed; healthy diet and alcohol recommendations (less than or equal to 2 drinks/day) reviewed; regular seatbelt use; changing batteries in smoke detectors. Immunization recommendations discussed.  Colonoscopy recommendations reviewed. ° °

## 2020-08-02 NOTE — Assessment & Plan Note (Signed)
Questionable sinus-post nasal drip vs GERD or combination Advised to start Pepcid and an OTC allergy medication like claritin for 3-4 weeks and if not improved referral to ENT will be made. Patient acknowledged agreement and understanding of the plan.

## 2020-08-02 NOTE — Assessment & Plan Note (Signed)
Referral to Derm to r/u start of basal cell ca Parents had skin cancer- high risk area as well  Encouraged sunscreen use

## 2020-08-02 NOTE — Assessment & Plan Note (Signed)
Referral to Derm to r/u start of basal cell ca Parents had skin cancer- high risk area as well- nose Encouraged sunscreen use

## 2020-08-02 NOTE — Patient Instructions (Signed)
  I appreciate the opportunity to provide you with care for your health and wellness. Today we discussed: overall health   Follow up: 1 year for CPE -fasting   Labs-fasting today  Referrals today: Dermatology   Start taking Claritin or Zyrtec daily as well as the Pepcid medication the urgent provided. If not better in 3-4 weeks we will do a ENT referral  Have a Merry Christmas and Happy New Year!   Please continue to practice social distancing to keep you, your family, and our community safe.  If you must go out, please wear a mask and practice good handwashing.  It was a pleasure to see you and I look forward to continuing to work together on your health and well-being. Please do not hesitate to call the office if you need care or have questions about your care.  Have a wonderful day. With Gratitude, Tereasa Coop, DNP, AGNP-BC  HEALTH MAINTENANCE RECOMMENDATIONS:  It is recommended that you get at least 30 minutes of aerobic exercise at least 5 days/week (for weight loss, you may need as much as 60-90 minutes). This can be any activity that gets your heart rate up. This can be divided in 10-15 minute intervals if needed, but try and build up your endurance at least once a week.  Weight bearing exercise is also recommended twice weekly.  Eat a healthy diet with lots of vegetables, fruits and fiber.  "Colorful" foods have a lot of vitamins (ie green vegetables, tomatoes, red peppers, etc).  Limit sweet tea, regular sodas and alcoholic beverages, all of which has a lot of calories and sugar.  Up to 2 alcoholic drinks daily may be beneficial for men (unless trying to lose weight, watch sugars).  Drink a lot of water.  Sunscreen of at least SPF 30 should be used on all sun-exposed parts of the skin when outside between the hours of 10 am and 4 pm (not just when at beach or pool, but even with exercise, golf, tennis, and yard work!)  Use a sunscreen that says "broad spectrum" so it covers  both UVA and UVB rays, and make sure to reapply every 1-2 hours.  Remember to change the batteries in your smoke detectors when changing your clock times in the spring and fall.  Use your seat belt every time you are in a car, and please drive safely and not be distracted with cell phones and texting while driving.

## 2020-08-02 NOTE — Assessment & Plan Note (Signed)
Overall working on PT exercises to see if can get things better. Will start injections if not improved.

## 2020-08-02 NOTE — Assessment & Plan Note (Signed)
Has not been taking medication as previously advised. Encouraged to start back given his S&S of GERD- with throat pain Patient acknowledged agreement and understanding of the plan.

## 2020-08-03 LAB — CBC
Hematocrit: 44.2 % (ref 37.5–51.0)
Hemoglobin: 14.7 g/dL (ref 13.0–17.7)
MCH: 30.9 pg (ref 26.6–33.0)
MCHC: 33.3 g/dL (ref 31.5–35.7)
MCV: 93 fL (ref 79–97)
Platelets: 306 10*3/uL (ref 150–450)
RBC: 4.76 x10E6/uL (ref 4.14–5.80)
RDW: 12.3 % (ref 11.6–15.4)
WBC: 6 10*3/uL (ref 3.4–10.8)

## 2020-08-03 LAB — LIPID PANEL
Chol/HDL Ratio: 4 ratio (ref 0.0–5.0)
Cholesterol, Total: 222 mg/dL — ABNORMAL HIGH (ref 100–199)
HDL: 55 mg/dL (ref >39)
LDL Chol Calc (NIH): 149 mg/dL — ABNORMAL HIGH (ref 0–99)
Triglycerides: 104 mg/dL (ref 0–149)
VLDL Cholesterol Cal: 18 mg/dL (ref 5–40)

## 2020-08-03 LAB — COMPREHENSIVE METABOLIC PANEL
ALT: 24 IU/L (ref 0–44)
AST: 21 IU/L (ref 0–40)
Albumin/Globulin Ratio: 2.4 — ABNORMAL HIGH (ref 1.2–2.2)
Albumin: 4.8 g/dL (ref 4.1–5.2)
Alkaline Phosphatase: 79 IU/L (ref 44–121)
BUN/Creatinine Ratio: 12 (ref 9–20)
BUN: 11 mg/dL (ref 6–20)
Bilirubin Total: <0.2 mg/dL (ref 0.0–1.2)
CO2: 24 mmol/L (ref 20–29)
Calcium: 9.6 mg/dL (ref 8.7–10.2)
Chloride: 104 mmol/L (ref 96–106)
Creatinine, Ser: 0.89 mg/dL (ref 0.76–1.27)
GFR calc Af Amer: 133 mL/min/{1.73_m2} (ref >59)
GFR calc non Af Amer: 115 mL/min/{1.73_m2} (ref >59)
Globulin, Total: 2 g/dL (ref 1.5–4.5)
Glucose: 86 mg/dL (ref 65–99)
Potassium: 4.6 mmol/L (ref 3.5–5.2)
Sodium: 141 mmol/L (ref 134–144)
Total Protein: 6.8 g/dL (ref 6.0–8.5)

## 2020-08-03 LAB — TSH: TSH: 1.33 u[IU]/mL (ref 0.450–4.500)

## 2020-08-03 LAB — HEMOGLOBIN A1C
Est. average glucose Bld gHb Est-mCnc: 100 mg/dL
Hgb A1c MFr Bld: 5.1 % (ref 4.8–5.6)

## 2020-09-11 DIAGNOSIS — I781 Nevus, non-neoplastic: Secondary | ICD-10-CM | POA: Diagnosis not present

## 2020-09-11 DIAGNOSIS — Z1283 Encounter for screening for malignant neoplasm of skin: Secondary | ICD-10-CM | POA: Diagnosis not present

## 2020-09-11 DIAGNOSIS — D225 Melanocytic nevi of trunk: Secondary | ICD-10-CM | POA: Diagnosis not present

## 2020-09-11 DIAGNOSIS — D485 Neoplasm of uncertain behavior of skin: Secondary | ICD-10-CM | POA: Diagnosis not present

## 2020-09-29 DIAGNOSIS — R0981 Nasal congestion: Secondary | ICD-10-CM | POA: Diagnosis not present

## 2020-09-29 DIAGNOSIS — R0989 Other specified symptoms and signs involving the circulatory and respiratory systems: Secondary | ICD-10-CM | POA: Diagnosis not present

## 2020-09-29 DIAGNOSIS — R059 Cough, unspecified: Secondary | ICD-10-CM | POA: Diagnosis not present

## 2020-10-25 ENCOUNTER — Other Ambulatory Visit: Payer: Self-pay

## 2020-10-25 ENCOUNTER — Ambulatory Visit: Payer: BC Managed Care – PPO | Admitting: Nurse Practitioner

## 2020-10-25 ENCOUNTER — Encounter: Payer: Self-pay | Admitting: Nurse Practitioner

## 2020-10-25 DIAGNOSIS — R079 Chest pain, unspecified: Secondary | ICD-10-CM | POA: Diagnosis not present

## 2020-10-25 MED ORDER — IBUPROFEN 600 MG PO TABS
600.0000 mg | ORAL_TABLET | Freq: Three times a day (TID) | ORAL | 0 refills | Status: DC | PRN
Start: 2020-10-25 — End: 2022-05-27

## 2020-10-25 MED ORDER — PREDNISONE 10 MG PO TABS
ORAL_TABLET | ORAL | 0 refills | Status: AC
Start: 2020-10-25 — End: 2020-10-31

## 2020-10-25 MED ORDER — TIZANIDINE HCL 4 MG PO TABS
4.0000 mg | ORAL_TABLET | Freq: Four times a day (QID) | ORAL | 0 refills | Status: DC | PRN
Start: 2020-10-25 — End: 2021-07-04

## 2020-10-25 NOTE — Assessment & Plan Note (Signed)
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 Tizanidine, ibuprofen, and prednisone

## 2020-10-25 NOTE — Progress Notes (Signed)
Acute Office Visit  Subjective:    [name], male    DOB: 1990-09-06, 30 y.o.   MRN: [id number]  Chief Complaint  Patient presents with  . Chest Pain    Hx of chest wall inflammation in the past. Chest pains daily x 2 weeks.    HPI Patient is in today for chest pain x2 weeks. He states he had chest inflammation in the past. His pain is substernal and radiates to left chest. He rates his pain at max of 8, but currently it is at 5-6. He states it is nagging pain that gets relieved with certain arm motions.  He works in tree removal.  Past Medical History:  Diagnosis Date  . Anxiety   . COVID-19 04/05/2020  . RUQ pain 08/03/2019   Added automatically from request for surgery [id number]    Past Surgical History:  Procedure Laterality Date  . BIOPSY  08/31/2019   Procedure: BIOPSY;  Surgeon: Malissa Hippo, MD;  Location: [facility];  Service: Endoscopy;;  duodenum, gastric  . ESOPHAGOGASTRODUODENOSCOPY N/A 08/31/2019   Procedure: ESOPHAGOGASTRODUODENOSCOPY (EGD);  Surgeon: Malissa Hippo, MD;  Location: [facility];  Service: Endoscopy;  Laterality: N/A;  2:50  . knee surgery Left   . LUMBAR SPINE SURGERY     2014    Family History  Problem Relation Age of Onset  . Cancer Father     Social History   Socioeconomic History  . Marital status: Married    Spouse name: Jarquavious Fentress   . Number of children: 2  . Years of education: Not on file  . Highest education level: High school graduate  Occupational History  . Occupation: Chief Financial Officer    Comment: tree trimmer   Tobacco Use  . Smoking status: Never Smoker  . Smokeless tobacco: Never Used  Vaping Use  . Vaping Use: Never used  Substance and Sexual Activity  . Alcohol use: Yes  . Drug use: Never  . Sexual activity: Yes  Other Topics Concern  . Not on file  Social History Narrative   Lives with wife [name] married a year in half, dated for 5 years before    2 children   Daughter 4-  Faith    Son 2- Engineer, maintenance       Pets: 2 dogs: American Bully: Cleo   Mutt: Dotson Mix: Pluto       Enjoys: watching football, outside, family and friends      Diet: junk food-chips, red meat, chicken, veggies and fruits, salads, was eating a lot of spicy foods, but trying to reduced   Water: 1 bottle a day   Caffiene: tea 2-3       Wears seat belt    Wears sun protection   Smoke detectors    Does not use phone while driving   Social Determinants of Health   Financial Resource Strain: Low Risk   . Difficulty of Paying Living Expenses: Not hard at all  Food Insecurity: No Food Insecurity  . Worried About Programme researcher, broadcasting/film/video in the Last Year: Never true  . Ran Out of Food in the Last Year: Never true  Transportation Needs: No Transportation Needs  . Lack of Transportation (Medical): No  . Lack of Transportation (Non-Medical): No  Physical Activity: Sufficiently Active  . Days of Exercise per Week: 7 days  . Minutes of Exercise per Session: 60 min  Stress: No Stress Concern Present  . Feeling of  Stress : Not at all  Social Connections: Moderately Integrated  . Frequency of Communication with Friends and Family: More than three times a week  . Frequency of Social Gatherings with Friends and Family: Once a week  . Attends Religious Services: More than 4 times per year  . Active Member of Clubs or Organizations: No  . Attends Banker Meetings: Never  . Marital Status: Married  Catering manager Violence: Not At Risk  . Fear of Current or Ex-Partner: No  . Emotionally Abused: No  . Physically Abused: No  . Sexually Abused: No    Outpatient Medications Prior to Visit  Medication Sig Dispense Refill  . famotidine (PEPCID) 20 MG tablet Take 1 tablet (20 mg total) by mouth 2 (two) times daily. (Patient not taking: Reported on 10/25/2020) 30 tablet 0   No facility-administered medications prior to visit.    No Known Allergies  Review of Systems  Constitutional:  Negative.   Respiratory: Negative.   Cardiovascular: Positive for chest pain and palpitations.  Musculoskeletal: Positive for myalgias.       Objective:    Physical Exam Constitutional:      Appearance: He is well-developed.  Cardiovascular:     Rate and Rhythm: Normal rate and regular rhythm.     Heart sounds: Normal heart sounds.  Pulmonary:     Effort: Pulmonary effort is normal.     Breath sounds: Normal breath sounds.  Musculoskeletal:        General: Normal range of motion.  Neurological:     Mental Status: He is alert.     BP 130/87   Pulse 72   Temp 98.1 F (36.7 C)   Resp 20   Ht 6' (1.829 m)   Wt 178 lb (80.7 kg)   SpO2 98%   BMI 24.14 kg/m  Wt Readings from Last 3 Encounters:  10/25/20 178 lb (80.7 kg)  08/02/20 178 lb (80.7 kg)  07/03/20 174 lb (78.9 kg)    Health Maintenance Due  Topic Date Due  . Hepatitis C Screening  Never done  . COVID-19 Vaccine (1) Never done    There are no preventive care reminders to display for this patient.   Lab Results  Component Value Date   TSH 1.330 08/02/2020   Lab Results  Component Value Date   WBC 6.0 08/02/2020   HGB 14.7 08/02/2020   HCT 44.2 08/02/2020   MCV 93 08/02/2020   PLT 306 08/02/2020   Lab Results  Component Value Date   NA 141 08/02/2020   K 4.6 08/02/2020   CO2 24 08/02/2020   GLUCOSE 86 08/02/2020   BUN 11 08/02/2020   CREATININE 0.89 08/02/2020   BILITOT <0.2 08/02/2020   ALKPHOS 79 08/02/2020   AST 21 08/02/2020   ALT 24 08/02/2020   PROT 6.8 08/02/2020   ALBUMIN 4.8 08/02/2020   CALCIUM 9.6 08/02/2020   Lab Results  Component Value Date   CHOL 222 (H) 08/02/2020   Lab Results  Component Value Date   HDL 55 08/02/2020   Lab Results  Component Value Date   LDLCALC 149 (H) 08/02/2020   Lab Results  Component Value Date   TRIG 104 08/02/2020   Lab Results  Component Value Date   CHOLHDL 4.0 08/02/2020   Lab Results  Component Value Date   HGBA1C 5.1  08/02/2020       Assessment & Plan:   Problem List Items Addressed This Visit  Other   Chest pain    -EKG today shows NSR with sinus arrhytmia; heart sounds were normal -he works in tree removal, 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 Tizanidine, ibuprofen, and prednisone          Meds ordered this encounter  Medications  . tiZANidine (ZANAFLEX) 4 MG tablet    Sig: Take 1 tablet (4 mg total) by mouth every 6 (six) hours as needed for muscle spasms.    Dispense:  30 tablet    Refill:  0  . predniSONE (DELTASONE) 10 MG tablet    Sig: Take 6 tablets (60 mg total) by mouth daily with breakfast for 1 day, THEN 5 tablets (50 mg total) daily with breakfast for 1 day, THEN 4 tablets (40 mg total) daily with breakfast for 1 day, THEN 3 tablets (30 mg total) daily with breakfast for 1 day, THEN 2 tablets (20 mg total) daily with breakfast for 1 day, THEN 1 tablet (10 mg total) daily with breakfast for 1 day.    Dispense:  21 tablet    Refill:  0  . ibuprofen (ADVIL) 600 MG tablet    Sig: Take 1 tablet (600 mg total) by mouth every 8 (eight) hours as needed for headache, mild pain or moderate pain.    Dispense:  30 tablet    Refill:  0     Heather Roberts, NP

## 2020-11-21 ENCOUNTER — Ambulatory Visit (INDEPENDENT_AMBULATORY_CARE_PROVIDER_SITE_OTHER): Payer: BC Managed Care – PPO | Admitting: Gastroenterology

## 2020-12-23 ENCOUNTER — Ambulatory Visit (INDEPENDENT_AMBULATORY_CARE_PROVIDER_SITE_OTHER): Payer: BC Managed Care – PPO | Admitting: Gastroenterology

## 2021-01-17 ENCOUNTER — Encounter (INDEPENDENT_AMBULATORY_CARE_PROVIDER_SITE_OTHER): Payer: Self-pay

## 2021-01-20 ENCOUNTER — Telehealth (INDEPENDENT_AMBULATORY_CARE_PROVIDER_SITE_OTHER): Payer: Self-pay

## 2021-01-20 ENCOUNTER — Ambulatory Visit (INDEPENDENT_AMBULATORY_CARE_PROVIDER_SITE_OTHER): Payer: BC Managed Care – PPO | Admitting: Gastroenterology

## 2021-01-20 NOTE — Telephone Encounter (Signed)
Patient no showed to appointment on 01/20/2021 with Dr. Katrinka Blazing at [facility].

## 2021-02-03 ENCOUNTER — Ambulatory Visit: Payer: BC Managed Care – PPO | Admitting: Nurse Practitioner

## 2021-03-18 DIAGNOSIS — M5440 Lumbago with sciatica, unspecified side: Secondary | ICD-10-CM | POA: Diagnosis not present

## 2021-03-18 DIAGNOSIS — R110 Nausea: Secondary | ICD-10-CM | POA: Diagnosis not present

## 2021-03-18 DIAGNOSIS — Z20822 Contact with and (suspected) exposure to covid-19: Secondary | ICD-10-CM | POA: Diagnosis not present

## 2021-03-18 DIAGNOSIS — G8929 Other chronic pain: Secondary | ICD-10-CM | POA: Diagnosis not present

## 2021-03-18 DIAGNOSIS — R1110 Vomiting, unspecified: Secondary | ICD-10-CM | POA: Diagnosis not present

## 2021-03-18 DIAGNOSIS — R197 Diarrhea, unspecified: Secondary | ICD-10-CM | POA: Diagnosis not present

## 2021-03-18 DIAGNOSIS — Z87891 Personal history of nicotine dependence: Secondary | ICD-10-CM | POA: Diagnosis not present

## 2021-03-18 DIAGNOSIS — M545 Low back pain, unspecified: Secondary | ICD-10-CM | POA: Diagnosis not present

## 2021-03-18 DIAGNOSIS — R112 Nausea with vomiting, unspecified: Secondary | ICD-10-CM | POA: Diagnosis not present

## 2021-05-09 ENCOUNTER — Other Ambulatory Visit: Payer: Self-pay

## 2021-05-09 ENCOUNTER — Ambulatory Visit
Admission: EM | Admit: 2021-05-09 | Discharge: 2021-05-09 | Disposition: A | Discharge status: Home / Self Care | Payer: BC Managed Care – PPO | Attending: Emergency Medicine | Admitting: Emergency Medicine

## 2021-05-09 DIAGNOSIS — Z711 Person with feared health complaint in whom no diagnosis is made: Secondary | ICD-10-CM | POA: Diagnosis not present

## 2021-05-09 LAB — POCT FASTING CBG KUC MANUAL ENTRY: POCT Glucose (KUC): 96 mg/dL (ref 70–99)

## 2021-05-09 NOTE — ED Provider Notes (Signed)
[facility]   [id number] 05/09/21 Arrival Time: 1220  ME:QASTM sugar check  SUBJECTIVE:  Matthew Andrews is a 30 y.o. male who complains of shaking and nausea x 1 day.  Checked blood sugar last night and it was 300+.  96 in office today.  No history of DM.  Would like sugar checked.  Denies alleviating or aggravating factors.  Denies previous symptoms in the past.  Denies fever, chills, CP, vomiting, SOB, fainting.     ROS: As per HPI.  All other pertinent ROS negative.     Past Medical History:  Diagnosis Date   Anxiety    COVID-19 04/05/2020   RUQ pain 08/03/2019   Added automatically from request for surgery [id number]   Past Surgical History:  Procedure Laterality Date   BIOPSY  08/31/2019   Procedure: BIOPSY;  Surgeon: Malissa Hippo, MD;  Location: [facility];  Service: Endoscopy;;  duodenum, gastric   ESOPHAGOGASTRODUODENOSCOPY N/A 08/31/2019   Procedure: ESOPHAGOGASTRODUODENOSCOPY (EGD);  Surgeon: Malissa Hippo, MD;  Location: [facility];  Service: Endoscopy;  Laterality: N/A;  2:50   knee surgery Left    LUMBAR SPINE SURGERY     2014   No Known Allergies No current facility-administered medications on file prior to encounter.   Current Outpatient Medications on File Prior to Encounter  Medication Sig Dispense Refill   ibuprofen (ADVIL) 600 MG tablet Take 1 tablet (600 mg total) by mouth every 8 (eight) hours as needed for headache, mild pain or moderate pain. 30 tablet 0   tiZANidine (ZANAFLEX) 4 MG tablet Take 1 tablet (4 mg total) by mouth every 6 (six) hours as needed for muscle spasms. 30 tablet 0   Social History   Socioeconomic History   Marital status: Married    Spouse name: Antion Andres    Number of children: 2   Years of education: Not on file   Highest education level: High school graduate  Occupational History   Occupation: Chief Financial Officer    Comment: tree trimmer   Tobacco Use   Smoking status: Never   Smokeless tobacco: Never   Vaping Use   Vaping Use: Never used  Substance and Sexual Activity   Alcohol use: Yes   Drug use: Never   Sexual activity: Yes  Other Topics Concern   Not on file  Social History Narrative   Lives with wife [name] married a year in half, dated for 5 years before    2 children   Daughter 4- Faith    Son 2- Engineer, maintenance       Pets: 2 dogs: American Bully: Cleo   Mutt: Dotson Mix: Pluto       Enjoys: watching football, outside, family and friends      Diet: junk food-chips, red meat, chicken, veggies and fruits, salads, was eating a lot of spicy foods, but trying to reduced   Water: 1 bottle a day   Caffiene: tea 2-3       Wears seat belt    Wears sun protection   Smoke detectors    Does not use phone while driving   Social Determinants of Corporate investment banker Strain: Low Risk    Difficulty of Paying Living Expenses: Not hard at all  Food Insecurity: No Food Insecurity   Worried About Programme researcher, broadcasting/film/video in the Last Year: Never true   Ran Out of Food in the Last Year: Never true  Transportation Needs: No Transportation Needs  Lack of Transportation (Medical): No   Lack of Transportation (Non-Medical): No  Physical Activity: Sufficiently Active   Days of Exercise per Week: 7 days   Minutes of Exercise per Session: 60 min  Stress: No Stress Concern Present   Feeling of Stress : Not at all  Social Connections: Moderately Integrated   Frequency of Communication with Friends and Family: More than three times a week   Frequency of Social Gatherings with Friends and Family: Once a week   Attends Religious Services: More than 4 times per year   Active Member of [employer] or Organizations: No   Attends Engineer, structural: Never   Marital Status: Married  Catering manager Violence: Not At Risk   Fear of Current or Ex-Partner: No   Emotionally Abused: No   Physically Abused: No   Sexually Abused: No   Family History  Problem Relation Age of Onset   Cancer Father      OBJECTIVE:  Vitals:   05/09/21 1228  BP: 134/81  Pulse: 95  Resp: 20  Temp: 97.7 F (36.5 C)  SpO2: 97%    General appearance: alert; no distress Eyes: PERRLA; EOMI HENT: normocephalic; atraumatic Neck: supple with FROM Lungs: clear to auscultation bilaterally Heart: regular rate and rhythm.   Abdomen: soft, nondistended, normal active bowel sounds; nontender to palpation; no guarding  Skin: warm and dry Neurologic: CN 2-12 grossly intact Psychological: alert and cooperative; normal mood and affect   ASSESSMENT & PLAN:  1. Physically well but worried    Blood sugar 96 in office Follow up with PCP for further evaluation and management Return or go to the ER if you have any new or worsening symptoms such as fever, chills, nausea, vomiting, chest pain, shortness of breath, cough, vision changes, abdominal pain, passing out, etc...  Reviewed expectations re: course of current medical issues. Questions answered. Outlined signs and symptoms indicating need for more acute intervention. Patient verbalized understanding. After Visit Summary given.    Rennis Harding, PA-C 05/09/21 1241

## 2021-05-09 NOTE — Discharge Instructions (Signed)
Blood sugar 96 in office Follow up with PCP for further evaluation and management Return or go to the ER if you have any new or worsening symptoms such as fever, chills, nausea, vomiting, chest pain, shortness of breath, cough, vision changes, abdominal pain, passing out, etc..Marland Kitchen

## 2021-05-09 NOTE — ED Triage Notes (Signed)
Pt was feeling shaky last night and used a a glucometer that stated Blood sugar was 358

## 2021-07-04 ENCOUNTER — Encounter: Payer: Self-pay | Admitting: Nurse Practitioner

## 2021-07-04 ENCOUNTER — Ambulatory Visit: Payer: BC Managed Care – PPO | Admitting: Nurse Practitioner

## 2021-07-04 ENCOUNTER — Other Ambulatory Visit: Payer: Self-pay

## 2021-07-04 VITALS — BP 128/78 | HR 90 | Temp 98.1°F | Ht 71.0 in | Wt 174.0 lb

## 2021-07-04 DIAGNOSIS — R109 Unspecified abdominal pain: Secondary | ICD-10-CM | POA: Diagnosis not present

## 2021-07-04 DIAGNOSIS — M4306 Spondylolysis, lumbar region: Secondary | ICD-10-CM

## 2021-07-04 DIAGNOSIS — N12 Tubulo-interstitial nephritis, not specified as acute or chronic: Secondary | ICD-10-CM

## 2021-07-04 DIAGNOSIS — Z23 Encounter for immunization: Secondary | ICD-10-CM

## 2021-07-04 LAB — POCT URINALYSIS DIP (CLINITEK)
Bilirubin, UA: NEGATIVE
Glucose, UA: NEGATIVE mg/dL
Ketones, POC UA: NEGATIVE mg/dL
Leukocytes, UA: NEGATIVE
Nitrite, UA: POSITIVE — AB
POC PROTEIN,UA: NEGATIVE
Spec Grav, UA: 1.02 (ref 1.010–1.025)
Urobilinogen, UA: 0.2 E.U./dL
pH, UA: 7 (ref 5.0–8.0)

## 2021-07-04 MED ORDER — SULFAMETHOXAZOLE-TRIMETHOPRIM 800-160 MG PO TABS: 1.0000 | ORAL_TABLET | Freq: Two times a day (BID) | ORAL | 0 refills | Status: DC

## 2021-07-04 NOTE — Patient Instructions (Addendum)
Bactrim has been ordered to treat your kidney infection. Please take all the medication as prescribed.  Drink plenty of water. If you develop fever use take tylenol 650 mg every 6 hours as needed.  Follow up in 4 days if your symptoms get worse.   We will get back to you about your lab work done today.

## 2021-07-04 NOTE — Progress Notes (Signed)
   Matthew Andrews     MRN: [id number]      DOB: July 16, 1991   HPI Matthew Andrews is here for acute visit.  Patient c/o left flank pain that started two weeks ago. Pain started gradually, dull achy pain., 6/10 today.Pain got worse yesterday'' it woke me up at night '' No dysuria, no bloody urine, no staining, no increased frequency., no penile discharge ,no fever , chills, seldomly have nausea, no vomiting, no bloody stool, no constipation, no diarrhea.   Does a lot of pulling and pushing and bending at work. Took  iburofen 800mg  and tylenol,did not help. heat and ice helps.    Pt c/o Intermittent bilateral hip pain throbbing aching pain, some tingling, take ibuprofen.    ROS Denies recent fever or chills. Denies sinus pressure, nasal congestion, ear pain or sore throat. Denies chest congestion, productive cough or wheezing. Denies chest pains, palpitations and leg swelling Denies abdominal pain, nausea, vomiting,diarrhea or constipation.   Denies dysuria, frequency, hesitancy or incontinence, has left flank pain Denies joint pain, swelling and limitation in mobility. Denies headaches, seizures, numbness, or tingling. Denies depression, anxiety or insomnia. Denies skin break down or rash.   PE  BP (!) 148/86 (BP Location: Right Arm, Patient Position: Sitting, Cuff Size: Normal)   Pulse 90   Temp 98.1 F (36.7 C) (Oral)   Ht 5\' 11"  (1.803 m)   Wt 174 lb (78.9 kg)   SpO2 97%   BMI 24.27 kg/m   Patient alert and oriented and in no cardiopulmonary distress.  HEENT: No facial asymmetry, EOMI,     Neck supple .  Chest: Clear to auscultation bilaterally.  CVS: S1, S2 no murmurs, no S3.Regular rate.  ABD: Soft non tender. Positive left CVA tenderness  Ext: No edema  MS: Adequate ROM spine, shoulders, hips and knees.  Skin: Intact, no ulcerations or rash noted.  Psych: Good eye contact, normal affect. Memory intact not anxious or depressed appearing.  CNS: CN 2-12 intact,  power,  normal throughout.no focal deficits noted.   Assessment & Plan

## 2021-07-04 NOTE — Assessment & Plan Note (Addendum)
Take ibuprofen as needed for pain.  Engage in regular  exercise.

## 2021-07-04 NOTE — Assessment & Plan Note (Addendum)
Positive nitrite and trace  blood.  Bactrim DS x 10 days.  Drink plenty of water. Tylenol 650mg  as needed for fever.  Urine c/s , GC chlamydia probe done  Follow up in 4 days if symptoms get worse.

## 2021-07-06 DIAGNOSIS — N481 Balanitis: Secondary | ICD-10-CM | POA: Diagnosis not present

## 2021-07-06 DIAGNOSIS — R109 Unspecified abdominal pain: Secondary | ICD-10-CM | POA: Diagnosis not present

## 2021-07-07 ENCOUNTER — Telehealth: Payer: Self-pay

## 2021-07-07 LAB — URINE CULTURE: Organism ID, Bacteria: NO GROWTH

## 2021-07-07 NOTE — Telephone Encounter (Signed)
Patient called seen his lab results, needs a better understanding what is going on with his urine lab work, please return call # [phone]

## 2021-07-08 ENCOUNTER — Ambulatory Visit (INDEPENDENT_AMBULATORY_CARE_PROVIDER_SITE_OTHER): Payer: BC Managed Care – PPO | Admitting: Gastroenterology

## 2021-07-08 LAB — GC/CHLAMYDIA PROBE AMP
Chlamydia trachomatis, NAA: NEGATIVE
Neisseria Gonorrhoeae by PCR: NEGATIVE

## 2021-08-06 ENCOUNTER — Encounter: Payer: BC Managed Care – PPO | Admitting: Family Medicine

## 2021-08-06 ENCOUNTER — Encounter: Payer: BC Managed Care – PPO | Admitting: Nurse Practitioner

## 2021-08-13 DIAGNOSIS — Z28310 Unvaccinated for covid-19: Secondary | ICD-10-CM | POA: Diagnosis not present

## 2021-08-13 DIAGNOSIS — U071 COVID-19: Secondary | ICD-10-CM | POA: Diagnosis not present

## 2021-08-13 DIAGNOSIS — R110 Nausea: Secondary | ICD-10-CM | POA: Diagnosis not present

## 2021-08-13 DIAGNOSIS — Z87891 Personal history of nicotine dependence: Secondary | ICD-10-CM | POA: Diagnosis not present

## 2021-08-13 DIAGNOSIS — R000 Tachycardia, unspecified: Secondary | ICD-10-CM | POA: Diagnosis not present

## 2021-08-17 DIAGNOSIS — H7192 Unspecified cholesteatoma, left ear: Secondary | ICD-10-CM

## 2021-08-17 HISTORY — PX: MIDDLE EAR SURGERY: SHX713

## 2021-08-17 HISTORY — DX: Unspecified cholesteatoma, left ear: H71.92

## 2021-09-10 DIAGNOSIS — H6121 Impacted cerumen, right ear: Secondary | ICD-10-CM | POA: Diagnosis not present

## 2021-09-10 DIAGNOSIS — H6692 Otitis media, unspecified, left ear: Secondary | ICD-10-CM | POA: Diagnosis not present

## 2021-09-15 ENCOUNTER — Encounter: Payer: Self-pay | Admitting: Nurse Practitioner

## 2021-09-16 ENCOUNTER — Ambulatory Visit: Payer: BC Managed Care – PPO | Admitting: Internal Medicine

## 2021-09-25 ENCOUNTER — Encounter: Payer: Self-pay | Admitting: Family Medicine

## 2021-09-25 ENCOUNTER — Other Ambulatory Visit: Payer: Self-pay

## 2021-09-25 ENCOUNTER — Ambulatory Visit: Payer: BC Managed Care – PPO | Admitting: Internal Medicine

## 2021-09-25 ENCOUNTER — Ambulatory Visit: Payer: BC Managed Care – PPO | Admitting: Family Medicine

## 2021-09-25 VITALS — BP 128/82 | HR 88 | Ht 72.0 in | Wt 174.1 lb

## 2021-09-25 DIAGNOSIS — R42 Dizziness and giddiness: Secondary | ICD-10-CM

## 2021-09-25 DIAGNOSIS — R030 Elevated blood-pressure reading, without diagnosis of hypertension: Secondary | ICD-10-CM

## 2021-09-25 DIAGNOSIS — M25571 Pain in right ankle and joints of right foot: Secondary | ICD-10-CM | POA: Insufficient documentation

## 2021-09-25 DIAGNOSIS — Z0001 Encounter for general adult medical examination with abnormal findings: Secondary | ICD-10-CM | POA: Diagnosis not present

## 2021-09-25 DIAGNOSIS — Z13220 Encounter for screening for lipoid disorders: Secondary | ICD-10-CM

## 2021-09-25 DIAGNOSIS — H9190 Unspecified hearing loss, unspecified ear: Secondary | ICD-10-CM

## 2021-09-25 DIAGNOSIS — H9202 Otalgia, left ear: Secondary | ICD-10-CM | POA: Diagnosis not present

## 2021-09-25 MED ORDER — CHLORPHENIRAMINE MALEATE 4 MG PO TABS: ORAL_TABLET | ORAL | 0 refills | Status: DC

## 2021-09-25 MED ORDER — MECLIZINE HCL 25 MG PO TABS: 25.0000 mg | ORAL_TABLET | Freq: Three times a day (TID) | ORAL | 0 refills | Status: DC | PRN

## 2021-09-25 MED ORDER — FLUTICASONE PROPIONATE 50 MCG/ACT NA SUSP: 2.0000 | Freq: Every day | NASAL | 6 refills | Status: DC

## 2021-09-25 NOTE — Patient Instructions (Addendum)
F/U with Fola in 4 months, call if you need to be seen sooner\  Urgent referrals to ENT, and Orthopedics  Three meds prescribed for ear symptoms  Fasting lipid, chem 7, and EGFR , CBC, in next 1  to  2 weeks  Normal Blood pressure today  Thanks for choosing [facility], we consider it a privelige to serve you.

## 2021-09-25 NOTE — Progress Notes (Signed)
° °  Matthew Andrews     MRN: [id number]      DOB: September 29, 1990   HPI Matthew Andrews is here c/o left ear pain and reduced hearing x years, had tubes as a child, treated for ear infection at least 3 times per year. Last night severe left ear pain, recently rx with amox and prednisone frfom UC , 1 week ago, still very symptomatic, pain down from 10 to 6, but new is vertigo x 2 days Right  Anterior  ankle pain, x months, more severe  in past 5 days, needs eval Sinus pressure and  drainage noted  ROS Denies recent fever or chills. Denies sore throat. Denies chest congestion, productive cough or wheezing. Denies chest pains, palpitations and leg swelling Denies abdominal pain, nausea, vomiting,diarrhea or constipation.   Denies dysuria, frequency, hesitancy or incontinence. Denies headaches, seizures, numbness, or tingling. Denies depression, anxiety or insomnia. Denies skin break down or rash.   PE  BP 140/86    Pulse 88    Ht 6' (1.829 m)    Wt 174 lb 1.9 oz (79 kg)    SpO2 97%    BMI 23.61 kg/m   Patient alert and oriented and in no cardiopulmonary distress.  HEENT: No facial asymmetry, EOMI,     Neck supple .TM no erythema, left TM decreased light reflex  Chest: Clear to auscultation bilaterally.  CVS: S1, S2 no murmurs, no S3.Regular rate.  ABD: Soft non tender.   Ext: No edema  MS: Adequate ROM spine, shoulders, hips and knees.decreased in right ankle  Skin: Intact, no ulcerations or rash noted.  Psych: Good eye contact, normal affect. Memory intact not anxious or depressed appearing.  CNS: CN 2-12 intact, power,  normal throughout.no focal deficits noted.   Assessment & Plan  Acute right ankle pain 5 day h/o increased pain with reduced mobility, no inciting trauma, refer Ortho  Otalgia of left ear Reports severe pain, hearing loss and recurrent infections with tubes in childhood, treat for allergic rhinitis/ sinusitis contributing to pain and pressure , urgent referal  to ENT  Elevated blood-pressure reading without diagnosis of hypertension sevreral elevated readings on record, will work on lifestyle, low threshold for starting med based on history in chart. Baseline labs and lipid screening with f/u with PCP DASH diet and commitment to daily physical activity for a minimum of 30 minutes discussed and encouraged, as a part of hypertension management. The importance of attaining a healthy weight is also discussed.  BP/Weight 09/25/2021 07/04/2021 05/09/2021 10/25/2020 08/02/2020 07/22/2020 [id number]  Systolic BP 0000000 0000000 [id number]  Diastolic BP 82 78 81 87 68 80 63  Wt. (Lbs) 174.12 174 - 178 178 - -  BMI 23.61 24.27 - 24.14 24.14 - -

## 2021-09-28 ENCOUNTER — Encounter: Payer: Self-pay | Admitting: Family Medicine

## 2021-09-28 DIAGNOSIS — H9202 Otalgia, left ear: Secondary | ICD-10-CM | POA: Insufficient documentation

## 2021-09-28 DIAGNOSIS — R030 Elevated blood-pressure reading, without diagnosis of hypertension: Secondary | ICD-10-CM | POA: Insufficient documentation

## 2021-09-28 DIAGNOSIS — Z13220 Encounter for screening for lipoid disorders: Secondary | ICD-10-CM | POA: Insufficient documentation

## 2021-09-28 NOTE — Assessment & Plan Note (Signed)
Reports severe pain, hearing loss and recurrent infections with tubes in childhood, treat for allergic rhinitis/ sinusitis contributing to pain and pressure , urgent referal to ENT

## 2021-09-28 NOTE — Assessment & Plan Note (Signed)
sevreral elevated readings on record, will work on lifestyle, low threshold for starting med based on history in chart. Baseline labs and lipid screening with f/u with PCP DASH diet and commitment to daily physical activity for a minimum of 30 minutes discussed and encouraged, as a part of hypertension management. The importance of attaining a healthy weight is also discussed.  BP/Weight 09/25/2021 07/04/2021 05/09/2021 10/25/2020 08/02/2020 07/22/2020 07/04/2020  Systolic BP 128 128 134 130 118 145 120  Diastolic BP 82 78 81 87 68 80 63  Wt. (Lbs) 174.12 174 - 178 178 - -  BMI 23.61 24.27 - 24.14 24.14 - -

## 2021-09-28 NOTE — Assessment & Plan Note (Signed)
5 day h/o increased pain with reduced mobility, no inciting trauma, refer Ortho

## 2021-09-29 ENCOUNTER — Ambulatory Visit: Payer: BC Managed Care – PPO | Admitting: Orthopedic Surgery

## 2021-10-21 ENCOUNTER — Telehealth: Payer: Self-pay | Admitting: Nurse Practitioner

## 2021-10-21 NOTE — Telephone Encounter (Signed)
Patient called in regard to Ear pain. ? ?Patient has current referral to ENT but appointment is not until May. ? ?Patient is having daily ear pain. ? ?Wants to see if provider can send in something to help with the ear pain until they can be seen at ENT office. ? ?Patient wants a call back in regard. ?

## 2021-10-22 ENCOUNTER — Other Ambulatory Visit: Payer: Self-pay | Admitting: Nurse Practitioner

## 2021-10-22 MED ORDER — OFLOXACIN 0.3 % OT SOLN: 10.0000 [drp] | Freq: Two times a day (BID) | OTIC | 0 refills | Status: AC

## 2021-10-22 NOTE — Telephone Encounter (Signed)
Please advise 

## 2021-10-22 NOTE — Telephone Encounter (Signed)
Spoke with pt advised of fola's message pt verbalized understanding ?

## 2021-10-22 NOTE — Telephone Encounter (Signed)
I have ordered ofloxacin ear drops. Place 10 drops into the left ear 2 (two) times daily for 14 days. Joliza please notify pt, thanks

## 2021-11-26 DIAGNOSIS — S99911A Unspecified injury of right ankle, initial encounter: Secondary | ICD-10-CM | POA: Diagnosis not present

## 2021-12-01 ENCOUNTER — Encounter (INDEPENDENT_AMBULATORY_CARE_PROVIDER_SITE_OTHER): Payer: Self-pay | Admitting: Gastroenterology

## 2021-12-01 ENCOUNTER — Ambulatory Visit (INDEPENDENT_AMBULATORY_CARE_PROVIDER_SITE_OTHER): Payer: BC Managed Care – PPO | Admitting: Gastroenterology

## 2021-12-01 VITALS — BP 126/77 | HR 97 | Temp 97.9°F | Ht 72.0 in | Wt 178.9 lb

## 2021-12-01 DIAGNOSIS — R1010 Upper abdominal pain, unspecified: Secondary | ICD-10-CM | POA: Diagnosis not present

## 2021-12-01 DIAGNOSIS — K219 Gastro-esophageal reflux disease without esophagitis: Secondary | ICD-10-CM

## 2021-12-01 DIAGNOSIS — R1013 Epigastric pain: Secondary | ICD-10-CM | POA: Insufficient documentation

## 2021-12-01 MED ORDER — PANTOPRAZOLE SODIUM 40 MG PO TBEC: 40.0000 mg | DELAYED_RELEASE_TABLET | Freq: Every day | ORAL | 1 refills | Status: DC

## 2021-12-01 NOTE — Progress Notes (Addendum)
? ?Referring Provider: Donell Beers, FNP ?Primary Care Physician:  Donell Beers, FNP ?Primary GI Physician: Rehman ? ?Chief Complaint  ?Patient presents with  ? Abdominal Pain  ?  Patient arrives today to discuss abdominal pain.   ? ?HPI:   ?Matthew Andrews is a 31 y.o. male with past medical history of anxiety, upper abdominal pain.  ? ?Patient presenting today for abdominal pain ? ?Last seen 01/10/20. Having RUQ pain at that time with previous EGD with findings of gastritis, unremarkable abd [place], and HIDA scan which showed normal GB function. Notably, pt reported improvement of pain at that time with PPI and dicyclomine. ? ?Patient reports that he previously had Left upper abdominal pain, had subsided after his last visit here. About a week ago he started noticing some RUQ pain that is more sharp/stabbing in nature, though pain sometimes travels across upper abdomen to LUQ as well. Usually will note worsening of pain with spicy foods, carbonated drinks, especially energy drinks as he drinks these daily. He also reports that he has intermittent generalized abdominal pain as well intermittent constipation and diarrhea that has been baseline for him for a while. He has some nausea but no vomiting. Reports hx of acid reflux previously that had gotten better but it has now returned. He takes tums for heartburn, maybe 3-4 x per day. States that this does provide some relief of his symptoms. Regular milk also gives him diarrhea. Drinks alcohol only occasionally. Denies early satiety or changes in his appetite but does feel somewhat "full" at times. No weight loss. States that when he eats cleaner with more fruits and veggies, symptoms improve. He denies rectal bleeding or melena.  ? ?NSAID [id number] ?Social hx: rarely ?Fam hx: grandmother had liver cirrhosis, non alcoholic ? ?Last Colonoscopy:never ?Last Endoscopy:  08/2018--Normal esophagus. ?- Z-line regular, 41 cm from the incisors. ?- Gastritis.  Biopsied. ?- Normal duodenal bulb and second portion of the duodenum. Biopsied. ? ?Recommendations:  ? ? ?Past Medical History:  ?Diagnosis Date  ? Anxiety   ? COVID-19 04/05/2020  ? RUQ pain 08/03/2019  ? Added automatically from request for surgery [id number]  ? ? ?Past Surgical History:  ?Procedure Laterality Date  ? BIOPSY  08/31/2019  ? Procedure: BIOPSY;  Surgeon: Malissa Hippo, MD;  Location: [facility];  Service: Endoscopy;;  duodenum, gastric  ? ESOPHAGOGASTRODUODENOSCOPY N/A 08/31/2019  ? Procedure: ESOPHAGOGASTRODUODENOSCOPY (EGD);  Surgeon: Malissa Hippo, MD;  Location: [facility];  Service: Endoscopy;  Laterality: N/A;  2:50  ? knee surgery Left   ? LUMBAR SPINE SURGERY    ? 2014  ? ? ?Current Outpatient Medications  ?Medication Sig Dispense Refill  ? chlorpheniramine (CHLOR-TRIMETON) 4 MG tablet Take one tablet by mouth once  daily x 5 days , then as needed (Patient not taking: Reported on 12/01/2021) 30 tablet 0  ? fluticasone (FLONASE) 50 MCG/ACT nasal spray Place 2 sprays into both nostrils daily. (Patient not taking: Reported on 12/01/2021) 16 g 6  ? ibuprofen (ADVIL) 600 MG tablet Take 1 tablet (600 mg total) by mouth every 8 (eight) hours as needed for headache, mild pain or moderate pain. (Patient not taking: Reported on 12/01/2021) 30 tablet 0  ? meclizine (ANTIVERT) 25 MG tablet Take 1 tablet (25 mg total) by mouth 3 (three) times daily as needed for dizziness. (Patient not taking: Reported on 12/01/2021) 30 tablet 0  ? ?No current facility-administered medications for this visit.  ? ? ?Allergies as of  12/01/2021  ? (No Known Allergies)  ? ? ?Family History  ?Problem Relation Age of Onset  ? Cancer Father   ? ? ?Social History  ? ?Socioeconomic History  ? Marital status: Married  ?  Spouse name: Matthew Andrews   ? Number of children: 2  ? Years of education: Not on file  ? Highest education level: High school graduate  ?Occupational History  ? Occupation: Chief Financial Officer  ?  Comment: tree  trimmer   ?Tobacco Use  ? Smoking status: Never  ?  Passive exposure: Never  ? Smokeless tobacco: Never  ?Vaping Use  ? Vaping Use: Never used  ?Substance and Sexual Activity  ? Alcohol use: Yes  ?  Comment: occasional  ? Drug use: Never  ? Sexual activity: Yes  ?Other Topics Concern  ? Not on file  ?Social History Narrative  ? Lives with wife [name] married a year in half, dated for 5 years before   ? 2 children  ? Daughter 4- [name]   ? Son 2- [name]   ?   ? Pets: 2 dogs: American Bully: Cleo   Mutt: Dotson Mix: Pluto   ?   ? Enjoys: watching football, outside, family and friends  ?   ? Diet: junk food-chips, red meat, chicken, veggies and fruits, salads, was eating a lot of spicy foods, but trying to reduced  ? Water: 1 bottle a day  ? Caffiene: tea 2-3   ?   ? Wears seat belt   ? Wears sun protection  ? Smoke detectors   ? Does not use phone while driving  ? ?Social Determinants of Health  ? ?Financial Resource Strain: Not on file  ?Food Insecurity: Not on file  ?Transportation Needs: Not on file  ?Physical Activity: Not on file  ?Stress: Not on file  ?Social Connections: Not on file  ? ?Review of systems ?General: negative for malaise, night sweats, fever, chills, weight loss ?Neck: Negative for lumps, goiter, pain and significant neck swelling ?Resp: Negative for cough, wheezing, dyspnea at rest ?CV: Negative for chest pain, leg swelling, palpitations, orthopnea ?GI: denies melena, hematochezia, vomiting,  dysphagia, odyonophagia, early satiety or unintentional weight loss. +upper abdominal pain, +constipation +diarrhea +nausea ?MSK: Negative for joint pain or swelling, back pain, and muscle pain. ?Derm: Negative for itching or rash ?Psych: Denies depression, anxiety, memory loss, confusion. No homicidal or suicidal ideation.  ?Heme: Negative for prolonged bleeding, bruising easily, and swollen nodes. ?Endocrine: Negative for cold or heat intolerance, polyuria, polydipsia and goiter. ?Neuro: negative for  tremor, gait imbalance, syncope and seizures. ?The remainder of the review of systems is noncontributory. ? ?Physical Exam: ?BP 126/77 (BP Location: Left Arm, Patient Position: Sitting, Cuff Size: Large)   Pulse 97   Temp 97.9 ?F (36.6 ?C) (Oral)   Ht 6' (1.829 m)   Wt 178 lb 14.4 oz (81.1 kg)   BMI 24.26 kg/m?  ?General:   Alert and oriented. No distress noted. Pleasant and cooperative.  ?Head:  Normocephalic and atraumatic. ?Eyes:  Conjuctiva clear without scleral icterus. ?Mouth:  Oral mucosa pink and moist. Good dentition. No lesions. ?Heart: Normal rate and rhythm, s1 and s2 heart sounds present.  ?Lungs: Clear lung sounds in all lobes. Respirations equal and unlabored. ?Abdomen:  +BS, soft, non-tender and non-distended. No rebound or guarding. No HSM or masses noted. ?Derm: No palmar erythema or jaundice ?Msk:  Symmetrical without gross deformities. Normal posture. ?Extremities:  Without edema. ?Neurologic:  Alert and  oriented  x4 ?Psych:  Alert and cooperative. Normal mood and affect. ? ?Invalid input(s): 6 MONTHS  ? ?ASSESSMENT: ?Meriel FlavorsJustin G Potocki is a 31 y.o. male presenting today for upper abdominal pain. ? ?Per patient previously seen for LUQ pain, however, per notes, patient worked up for RUQ pain, last seen in 2021 with EGD with gastritis, US and HIDA scan both unremarkable. Pt had improvement of symptoms at that time with both PPI and Dicyclomine. He states now that he has upper abdominal pain, usually on the right but sometimes pain travels across and he has pain in LUQ as well. Experiences some nausea without vomiting. spicy foods, dairy, and caffeine seem to worsen pain/nausea. Has chronic intermittent diarrhea and constipation that alternate. Reports dairy worsens his GI symptoms. Taking tums 3-4x/day for acid reflux with some relief of upper abdominal pain, thereafter. No early satiety, weight loss, rectal bleeding or melena. Abdominal exam is benign. No high risk substances such as frequent  ETOH or NSAIDs. Will start pt on PPI and trial low FODMAP diet as I suspect that symptoms are a combination of both gastritis/uncontrolled GERD and  IBS. Also advised him to keep food journal over the next 2-3 weeks and

## 2021-12-01 NOTE — Patient Instructions (Signed)
I am providing the low FODMAP diet for you to try, as we discussed, you can try writing down things that you eat and symptoms that you have for 2-3 weeks, this helps to correlate which foods seem to be triggers for you ? ?I have sent Prootnix 40mg  to your pharmacy ?Please take this 30 minutes prior to breakfast ?Avoid greasy, spicy, fried, citrus foods, and be mindful that caffeine, carbonated drinks, chocolate and alcohol can increase reflux symptoms ?Stay upright 2-3 hours after eating, prior to lying down and avoid eating late in the evenings. ? ?Please let me know if there is no improvement in your symptoms over the next 2-3 weeks.  ?I suspect this could be related to both acid reflux/gastritis and some possible underlying IBS. ?If symptoms fail to improve, we will discuss further evaluation.  ? ?Follow up 3 months ?

## 2021-12-16 IMAGING — US US ABDOMEN LIMITED
1 series · 14 of 25 positions shown · non-contrast
Comparison: June 29, 2017

CLINICAL DATA: Right upper quadrant pain

EXAM:
ULTRASOUND ABDOMEN LIMITED RIGHT UPPER QUADRANT

[Series 1: us abdomen limited · 14 of 42 slices shown]
[im 1/42]
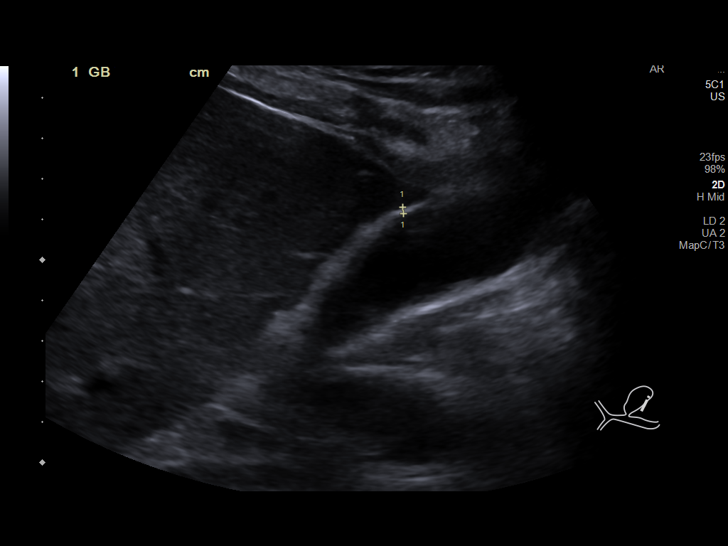
[im 4/42]
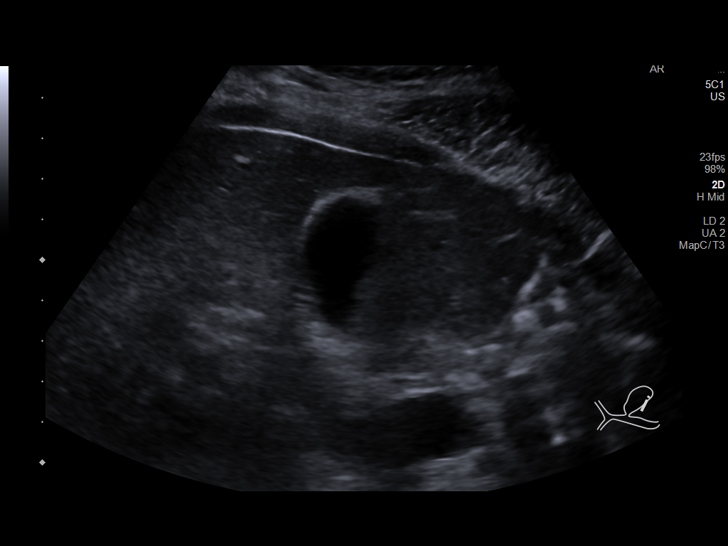
[im 7/42]
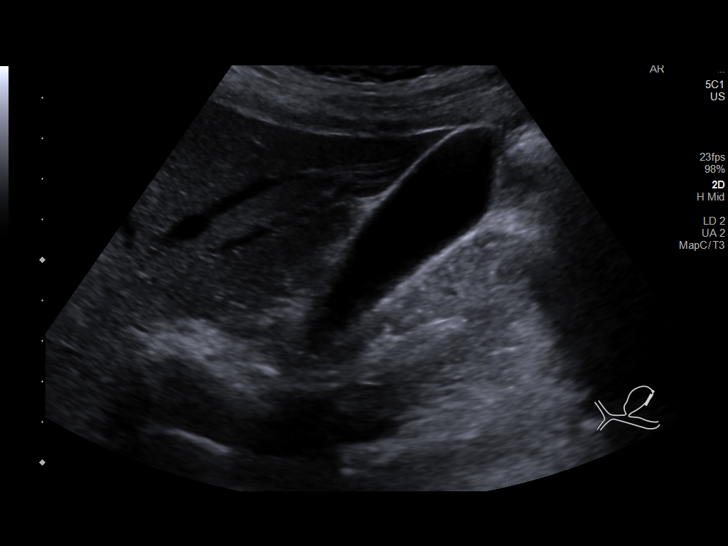
[im 11/42]
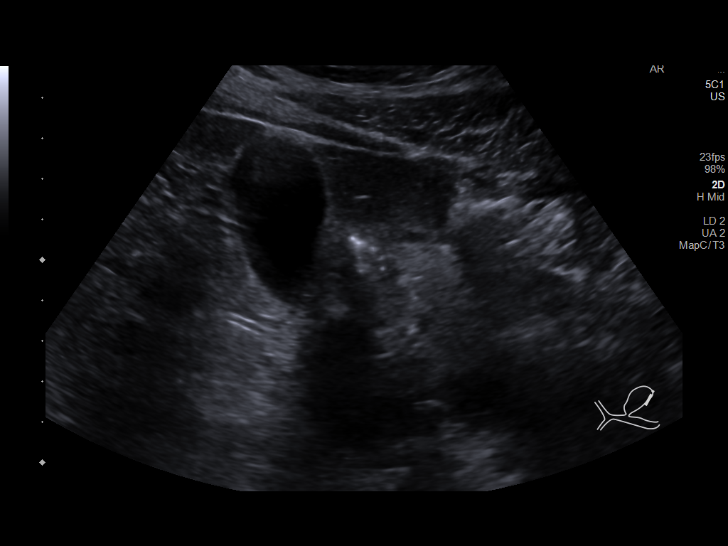
[im 14/42]
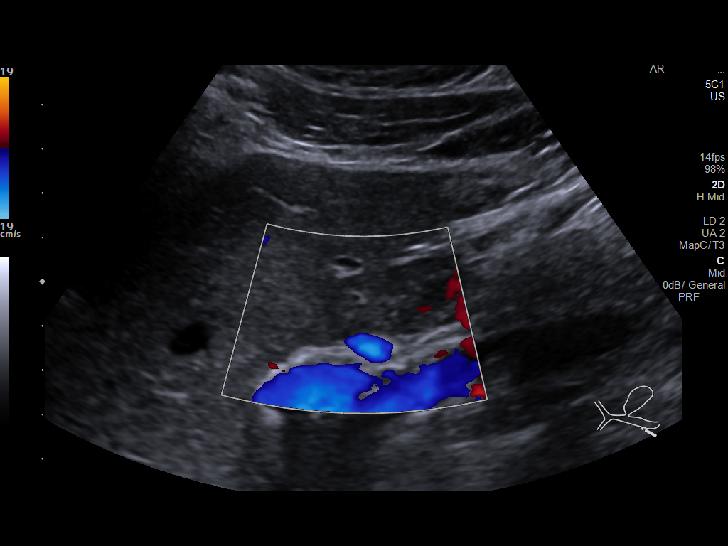
[im 16/42]
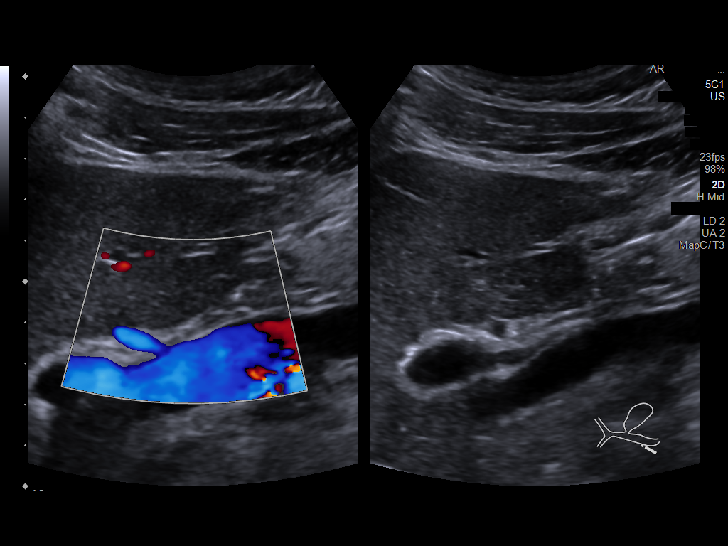
[im 19/42]
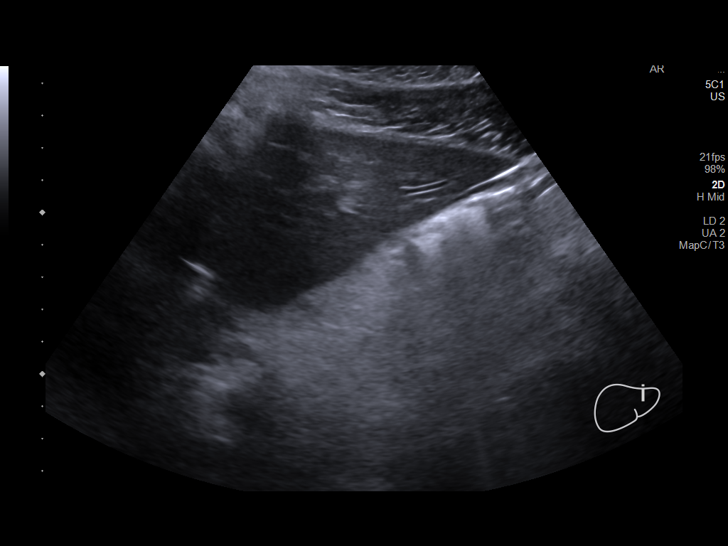
[im 23/42]
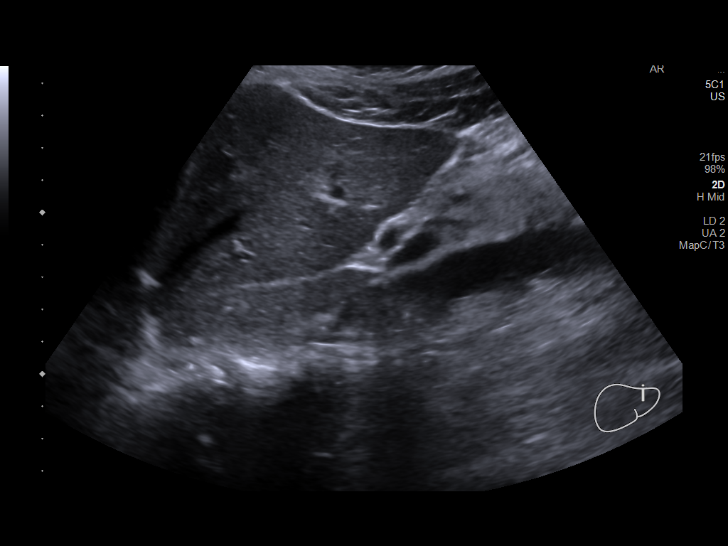
[im 26/42]
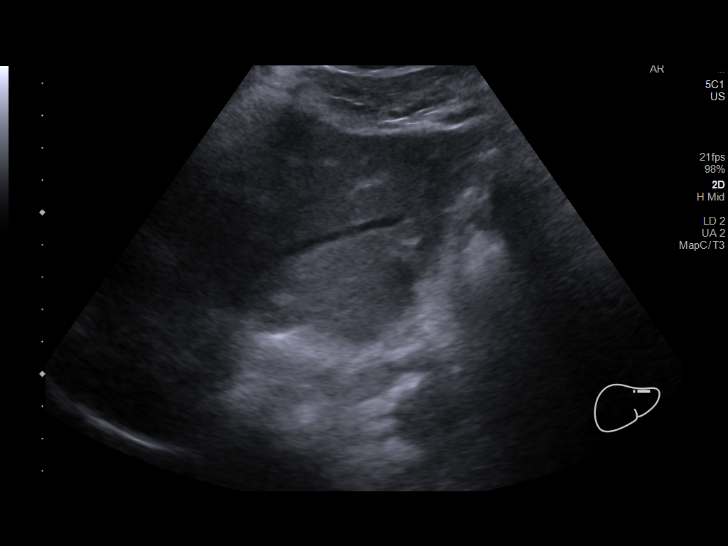
[im 28/42]
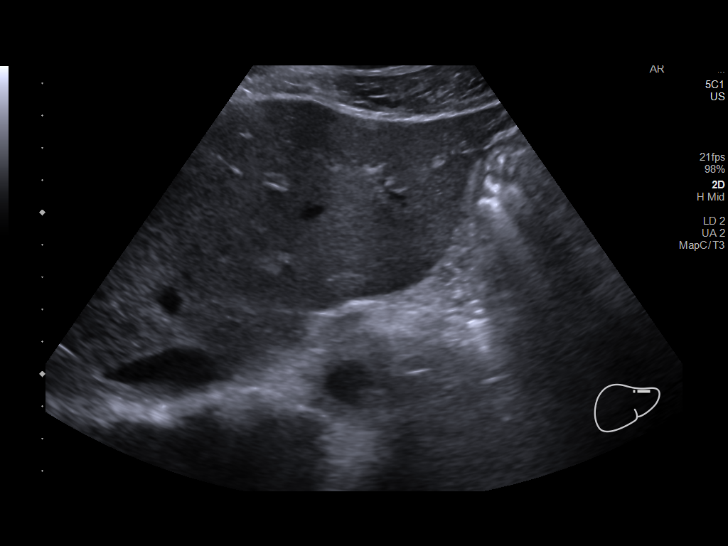
[im 31/42]
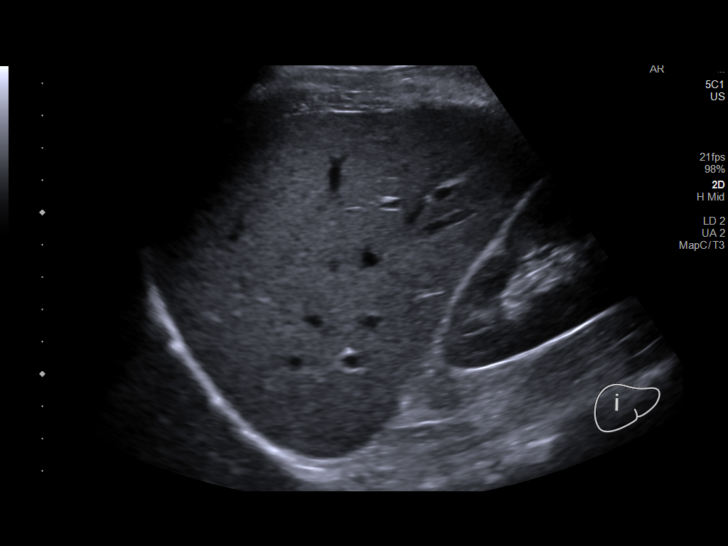
[im 35/42]
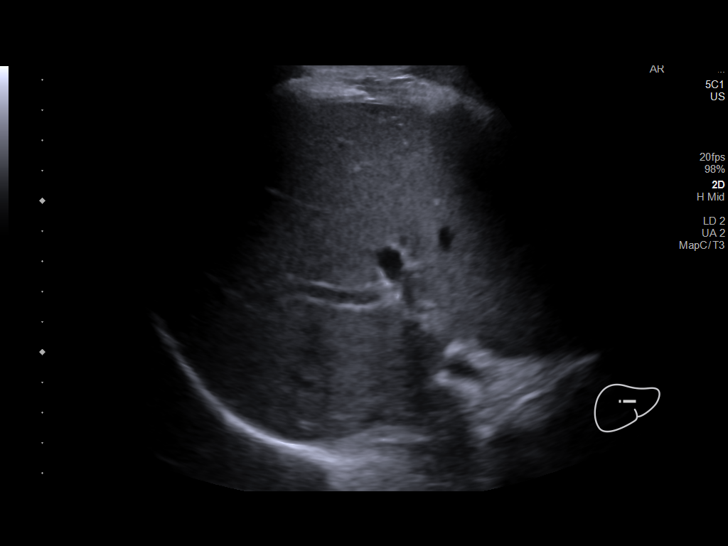
[im 38/42]
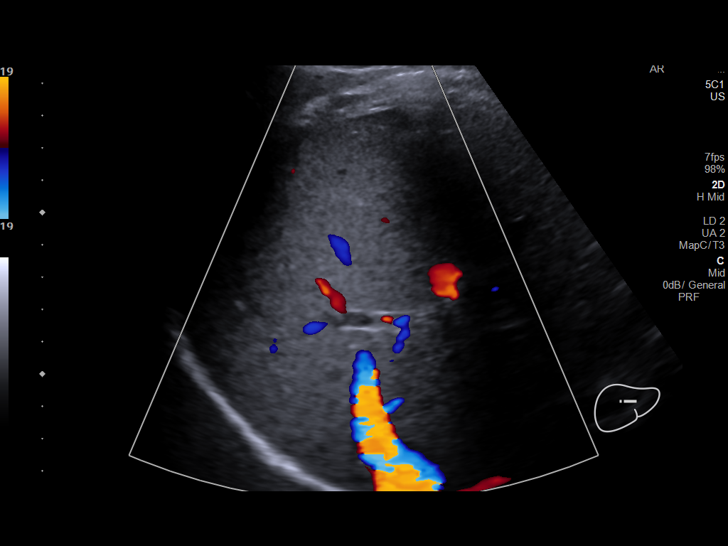
[im 42/42]
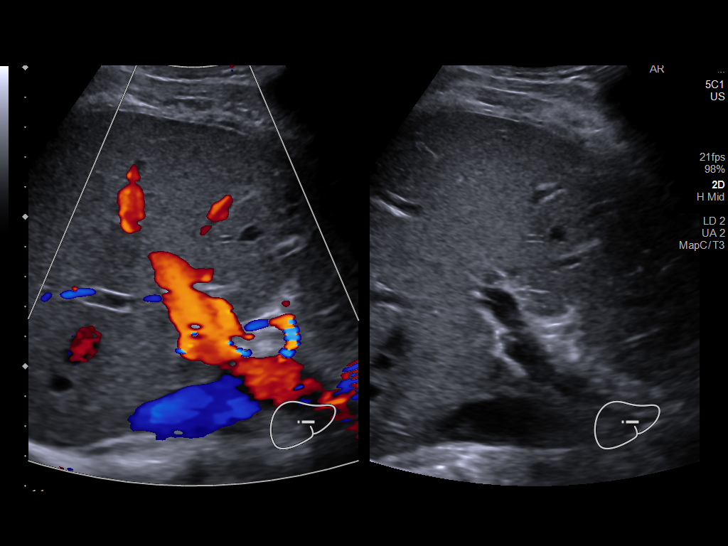

[14 of 25 positions shown; findings below may reference images not displayed]

FINDINGS: Gallbladder:

No gallstones or wall thickening visualized. There is no
pericholecystic fluid. No sonographic Murphy sign noted by
sonographer.

Common bile duct:

Diameter: 2 mm. No intrahepatic or extrahepatic biliary duct
dilatation.

Liver:

No focal lesion identified. Within normal limits in parenchymal
echogenicity. Portal vein is patent on color Doppler imaging with
normal direction of blood flow towards the liver.

Other: None.
IMPRESSION: Study within normal limits.

## 2021-12-18 DIAGNOSIS — R42 Dizziness and giddiness: Secondary | ICD-10-CM | POA: Diagnosis not present

## 2021-12-18 DIAGNOSIS — H9012 Conductive hearing loss, unilateral, left ear, with unrestricted hearing on the contralateral side: Secondary | ICD-10-CM | POA: Diagnosis not present

## 2021-12-18 DIAGNOSIS — H6122 Impacted cerumen, left ear: Secondary | ICD-10-CM | POA: Diagnosis not present

## 2021-12-25 ENCOUNTER — Other Ambulatory Visit: Payer: Self-pay | Admitting: Otolaryngology

## 2021-12-25 ENCOUNTER — Other Ambulatory Visit (HOSPITAL_COMMUNITY): Payer: Self-pay | Admitting: Otolaryngology

## 2021-12-25 DIAGNOSIS — H9012 Conductive hearing loss, unilateral, left ear, with unrestricted hearing on the contralateral side: Secondary | ICD-10-CM

## 2021-12-31 DIAGNOSIS — R42 Dizziness and giddiness: Secondary | ICD-10-CM | POA: Diagnosis not present

## 2022-01-02 ENCOUNTER — Ambulatory Visit (HOSPITAL_COMMUNITY)
Admission: RE | Admit: 2022-01-02 | Discharge: 2022-01-02 | Disposition: A | Discharge status: Home / Self Care | Payer: BC Managed Care – PPO | Source: Ambulatory Visit | Attending: Otolaryngology | Admitting: Otolaryngology

## 2022-01-02 DIAGNOSIS — R42 Dizziness and giddiness: Secondary | ICD-10-CM | POA: Diagnosis not present

## 2022-01-02 DIAGNOSIS — H9012 Conductive hearing loss, unilateral, left ear, with unrestricted hearing on the contralateral side: Secondary | ICD-10-CM

## 2022-01-02 DIAGNOSIS — R519 Headache, unspecified: Secondary | ICD-10-CM | POA: Diagnosis not present

## 2022-01-07 DIAGNOSIS — H9312 Tinnitus, left ear: Secondary | ICD-10-CM | POA: Diagnosis not present

## 2022-01-07 DIAGNOSIS — H9012 Conductive hearing loss, unilateral, left ear, with unrestricted hearing on the contralateral side: Secondary | ICD-10-CM | POA: Diagnosis not present

## 2022-01-07 DIAGNOSIS — R42 Dizziness and giddiness: Secondary | ICD-10-CM | POA: Diagnosis not present

## 2022-01-23 ENCOUNTER — Encounter: Payer: Self-pay | Admitting: Nurse Practitioner

## 2022-01-23 ENCOUNTER — Ambulatory Visit: Payer: BC Managed Care – PPO | Admitting: Nurse Practitioner

## 2022-01-23 VITALS — BP 117/78 | HR 73 | Ht 72.0 in | Wt 185.0 lb

## 2022-01-23 DIAGNOSIS — H838X2 Other specified diseases of left inner ear: Secondary | ICD-10-CM

## 2022-01-23 DIAGNOSIS — K219 Gastro-esophageal reflux disease without esophagitis: Secondary | ICD-10-CM

## 2022-01-23 NOTE — Assessment & Plan Note (Signed)
patient stated that he has seen Dr Danice Goltz, who  referred him to ENT surgery in [facility] after being evaluated  by  him. Patient is waiting on appointment with the ENT surgeon. Patient encouraged to maintain close follow-up with ENT.

## 2022-01-23 NOTE — Assessment & Plan Note (Signed)
Continue Protonix 40 mg daily Avoid fried spicy foods caffeinated drinks alcohol fatty foods fried foods. Patient encouraged to maintain close follow-up with GI.

## 2022-01-23 NOTE — Progress Notes (Signed)
   Matthew Andrews     MRN: [id number]      DOB: 16-Jun-1991   HPI Matthew Andrews with past medical history of chronic GERD, otalgia of left ear is here for follow up for left ear pain.  Superior canal dehiscence syndrome. Patient stated that he has seen Dr Danice Goltz who told him he has SCDS, he was  referred him to ENT surgery in [facility] after being evaluated  by Dr Danice Goltz. Patient is waiting on appointment with the ENT surgeon .Still has some  cold sensation in his ear, has moderate hearing loss, balance issues. .  Taking Antivert 25 mg 3 times daily as needed.  GERD.  Currently taking Protonix 40 mg daily, states that his symptoms her much better, he has been trying to avoid offending foods.  Currently denies abdominal pain nausea vomiting.   ROS Denies recent fever or chills. Denies sinus pressure, nasal congestion,sore throat. Denies chest congestion, productive cough or wheezing. Denies chest pains, palpitations and leg swelling Denies abdominal pain, nausea, vomiting,diarrhea or constipation.   Denies dysuria, frequency, hesitancy or incontinence. Denies depression, anxiety or insomnia.   PE  BP 117/78 (BP Location: Right Arm, Patient Position: Sitting, Cuff Size: Large)   Pulse 73   Ht 6' (1.829 m)   Wt 185 lb (83.9 kg)   SpO2 98%   BMI 25.09 kg/m   Patient alert and oriented and in no cardiopulmonary distress.  HEENT: No facial asymmetry, EOMI,     Neck supple . Cerumen noted in bilateral ears, no redness, drainage noted.   Chest: Clear to auscultation bilaterally.  CVS: S1, S2 no murmurs, no S3.Regular rate.  ABD: Soft non tender.   Ext: No edema  MS: Adequate ROM spine, shoulders, hips and knees.  Psych: Good eye contact, normal affect. Memory intact not anxious or depressed appearing.   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 Patient is waiting on appointment with the ENT surgeon. Patient encouraged to maintain close follow-up with ENT.  Chronic GERD Continue Protonix 40 mg daily Avoid fried spicy foods caffeinated drinks alcohol fatty foods fried foods. Patient encouraged to maintain close follow-up with GI.

## 2022-01-23 NOTE — Patient Instructions (Signed)

## 2022-03-02 ENCOUNTER — Ambulatory Visit (INDEPENDENT_AMBULATORY_CARE_PROVIDER_SITE_OTHER): Payer: BC Managed Care – PPO | Admitting: Gastroenterology

## 2022-03-02 ENCOUNTER — Encounter (INDEPENDENT_AMBULATORY_CARE_PROVIDER_SITE_OTHER): Payer: Self-pay | Admitting: Gastroenterology

## 2022-03-12 DIAGNOSIS — H6622 Chronic atticoantral suppurative otitis media, left ear: Secondary | ICD-10-CM | POA: Diagnosis not present

## 2022-04-02 DIAGNOSIS — H900 Conductive hearing loss, bilateral: Secondary | ICD-10-CM | POA: Diagnosis not present

## 2022-04-02 DIAGNOSIS — H6622 Chronic atticoantral suppurative otitis media, left ear: Secondary | ICD-10-CM | POA: Diagnosis not present

## 2022-04-08 DIAGNOSIS — H7120 Cholesteatoma of mastoid, unspecified ear: Secondary | ICD-10-CM | POA: Diagnosis not present

## 2022-04-08 DIAGNOSIS — H7102 Cholesteatoma of attic, left ear: Secondary | ICD-10-CM | POA: Diagnosis not present

## 2022-04-08 DIAGNOSIS — H6692 Otitis media, unspecified, left ear: Secondary | ICD-10-CM | POA: Diagnosis not present

## 2022-04-08 DIAGNOSIS — H7192 Unspecified cholesteatoma, left ear: Secondary | ICD-10-CM | POA: Diagnosis not present

## 2022-04-24 ENCOUNTER — Ambulatory Visit (INDEPENDENT_AMBULATORY_CARE_PROVIDER_SITE_OTHER): Payer: BC Managed Care – PPO | Admitting: Nurse Practitioner

## 2022-04-24 ENCOUNTER — Encounter: Payer: Self-pay | Admitting: Nurse Practitioner

## 2022-04-24 ENCOUNTER — Telehealth: Payer: Self-pay | Admitting: Internal Medicine

## 2022-04-24 VITALS — BP 131/80 | HR 84 | Ht 72.0 in | Wt 181.0 lb

## 2022-04-24 DIAGNOSIS — J392 Other diseases of pharynx: Secondary | ICD-10-CM

## 2022-04-24 DIAGNOSIS — Z23 Encounter for immunization: Secondary | ICD-10-CM | POA: Insufficient documentation

## 2022-04-24 DIAGNOSIS — Z0001 Encounter for general adult medical examination with abnormal findings: Secondary | ICD-10-CM

## 2022-04-24 DIAGNOSIS — H7192 Unspecified cholesteatoma, left ear: Secondary | ICD-10-CM | POA: Diagnosis not present

## 2022-04-24 DIAGNOSIS — Z0000 Encounter for general adult medical examination without abnormal findings: Secondary | ICD-10-CM

## 2022-04-24 DIAGNOSIS — K219 Gastro-esophageal reflux disease without esophagitis: Secondary | ICD-10-CM | POA: Diagnosis not present

## 2022-04-24 LAB — POCT RAPID STREP A (OFFICE): Rapid Strep A Screen: NEGATIVE

## 2022-04-24 NOTE — Telephone Encounter (Signed)
Please advise 

## 2022-04-24 NOTE — Assessment & Plan Note (Signed)
Strep test was negative Redness and irritation most likely due to his recent ear surgery  follw up with ENT as planned

## 2022-04-24 NOTE — Assessment & Plan Note (Signed)
Patient educated on CDC recommendation for the vaccine. Verbal consent was obtained from the patient, vaccine administered by nurse, no sign of adverse reactions noted at this time. Patient education on arm soreness and use of tylenol or ibuprofen for this patient  was discussed. Patient educated on the signs and symptoms of adverse effect and advise to contact the office if they occur.  

## 2022-04-24 NOTE — Assessment & Plan Note (Signed)
Recently had ear surgery Not having dizziness anymore Incision site healing well Has decreased hearing on the left ear He has been taking Tylenol and ibuprofen as needed for pain Patient encouraged to maintain close follow-up with ENT surgeon he verbalized understanding

## 2022-04-24 NOTE — Telephone Encounter (Signed)
Patient does not have Trush, his test was also negative for strep. He should gargle with warm salt water if his throat feels sore he can buy OTC chloraseptic throat spray and use as needed.

## 2022-04-24 NOTE — Assessment & Plan Note (Signed)
Metallic taste could be due to his GERD Patient encouraged to avoid fried fatty foods, spicy foods and other offending foods

## 2022-04-24 NOTE — Progress Notes (Signed)
Complete physical exam  Patient: Matthew Andrews   DOB: 29-Mar-1991   31 y.o. Male  MRN: [id number]  Subjective:    Chief Complaint  Patient presents with   Annual Exam    cpe    Matthew Andrews is a 31 y.o. male with past medical history of GERD, who presents today for a complete physical exam. He reports consuming a general diet. He generally feels well. He reports  not sleeping well since ear surgery, does some walking exercises around his neighborhood.   Had ear surgery on 8/21. No more having dizziness since about a week ago. Incision site healing well, packing in place. He Will start ofloxacin ear drop after 9/23.  Has been for follw up once since after surgery , next follow up is in 3 months. Cyst removed from inner ear, ear drum replaced, has decreased hearing on the left ear. Takes tylenol and ibuprofen as needed for pain    He has stopped taking hydrochlorothiazide and potassium due to bad metallic taste, he was told to take both medications because they told him her had excess fluid in his ears. He took both meds for about a week, patient encouraged to get in touch with the surgeon and ask if he should continue to take med or not. Mettalic taste might be from his GERD.    , no fever , chills, no trouble , ha    Most recent fall risk assessment:    04/24/2022    8:19 AM  Fall Risk   Falls in the past year? 0  Number falls in past yr: 0  Injury with Fall? 0  Risk for fall due to : No Fall Risks  Follow up Falls evaluation completed     Most recent depression screenings:    04/24/2022    8:19 AM 01/23/2022    8:33 AM  PHQ 2/9 Scores  PHQ - 2 Score 0 0        Patient Care Team: Johnette Abraham, MD as PCP - General (Internal Medicine)   Outpatient Medications Prior to Visit  Medication Sig   fluticasone (FLONASE) 50 MCG/ACT nasal spray Place 2 sprays into both nostrils daily.   ibuprofen (ADVIL) 600 MG tablet Take 1 tablet (600 mg total) by mouth every 8  (eight) hours as needed for headache, mild pain or moderate pain.   chlorpheniramine (CHLOR-TRIMETON) 4 MG tablet Take one tablet by mouth once  daily x 5 days , then as needed (Patient not taking: Reported on 12/01/2021)   hydrochlorothiazide (HYDRODIURIL) 25 MG tablet Take 25 mg by mouth every morning. (Patient not taking: Reported on 04/24/2022)   meclizine (ANTIVERT) 25 MG tablet Take 1 tablet (25 mg total) by mouth 3 (three) times daily as needed for dizziness. (Patient not taking: Reported on 04/24/2022)   ofloxacin (FLOXIN) 0.3 % OTIC solution SMARTSIG:In Ear(s) (Patient not taking: Reported on 04/24/2022)   pantoprazole (PROTONIX) 40 MG tablet Take 1 tablet (40 mg total) by mouth daily. (Patient not taking: Reported on 04/24/2022)   potassium chloride SA (KLOR-CON M) 20 MEQ tablet Take 20 mEq by mouth daily. (Patient not taking: Reported on 04/24/2022)   No facility-administered medications prior to visit.    Review of Systems  Constitutional:  Negative for chills, fever and weight loss.  HENT:  Positive for ear pain and hearing loss.   Eyes: Negative.   Respiratory: Negative.    Cardiovascular: Negative.   Gastrointestinal: Negative.   Genitourinary: Negative.  Musculoskeletal: Negative.   Skin:  Negative for itching and rash.  Neurological: Negative.   Psychiatric/Behavioral: Negative.            Objective:     BP 131/80 (BP Location: Left Arm, Patient Position: Sitting, Cuff Size: Normal)   Pulse 84   Ht 6' (1.829 m)   Wt 181 lb (82.1 kg)   SpO2 97%   BMI 24.55 kg/m    Physical Exam Constitutional:      Appearance: Normal appearance.  HENT:     Head:     Comments: Red spots noted in throat     Right Ear: Ear canal and external ear normal. There is no impacted cerumen.     Ears:     Comments: Surgical incision on left ear healing well, cotton wool packing in place     Nose: No congestion or rhinorrhea.     Mouth/Throat:     Mouth: Mucous membranes are moist.      Dentition: Normal dentition. Does not have dentures.     Palate: Lesions present.     Pharynx: Posterior oropharyngeal erythema present. No pharyngeal swelling, oropharyngeal exudate or uvula swelling.     Tonsils: No tonsillar exudate or tonsillar abscesses.  Eyes:     General: No scleral icterus.       Right eye: No discharge.        Left eye: No discharge.     Extraocular Movements: Extraocular movements intact.     Pupils: Pupils are equal, round, and reactive to light.  Neck:     Vascular: No carotid bruit.  Cardiovascular:     Rate and Rhythm: Normal rate and regular rhythm.     Pulses: Normal pulses.     Heart sounds: Normal heart sounds. No murmur heard.    No friction rub. No gallop.  Pulmonary:     Effort: Pulmonary effort is normal. No respiratory distress.     Breath sounds: Normal breath sounds. No stridor. No wheezing, rhonchi or rales.  Chest:     Chest wall: No tenderness.  Abdominal:     General: There is no distension.     Palpations: Abdomen is soft. There is no mass.     Tenderness: There is no abdominal tenderness. There is no right CVA tenderness, left CVA tenderness, guarding or rebound.     Hernia: No hernia is present.  Musculoskeletal:        General: No swelling, tenderness, deformity or signs of injury. Normal range of motion.     Cervical back: Normal range of motion and neck supple. No rigidity or tenderness.     Right lower leg: No edema.     Left lower leg: No edema.  Lymphadenopathy:     Cervical: No cervical adenopathy.  Skin:    Capillary Refill: Capillary refill takes less than 2 seconds.     Coloration: Skin is not jaundiced or pale.     Findings: No bruising, erythema, lesion or rash.  Neurological:     Mental Status: He is alert and oriented to person, place, and time.     Cranial Nerves: No cranial nerve deficit.     Sensory: No sensory deficit.     Motor: No weakness.     Coordination: Coordination normal.     Gait: Gait normal.      Deep Tendon Reflexes: Reflexes normal.  Psychiatric:        Mood and Affect: Mood normal.  Behavior: Behavior normal.        Thought Content: Thought content normal.        Judgment: Judgment normal.      Results for orders placed or performed in visit on 04/24/22  POCT rapid strep A  Result Value Ref Range   Rapid Strep A Screen Negative Negative       Assessment & Plan:    Routine Health Maintenance and Physical Exam  Immunization History  Administered Date(s) Administered   Influenza,inj,Quad PF,6+ Mos 05/24/2019, 05/22/2020, 07/04/2021, 04/24/2022    Health Maintenance  Topic Date Due   Hepatitis C Screening  Never done   TETANUS/TDAP  04/19/2024   INFLUENZA VACCINE  Completed   HIV Screening  Completed   HPV VACCINES  Aged Out   COVID-19 Vaccine  Discontinued    Discussed health benefits of physical activity, and encouraged him to engage in regular exercise appropriate for his age and condition.  Problem List Items Addressed This Visit       Digestive   Chronic GERD    Metallic taste could be due to his GERD Patient encouraged to avoid fried fatty foods, spicy foods and other offending foods        Nervous and Auditory   Cholesteatoma of ear, left    Recently had ear surgery Not having dizziness anymore Incision site healing well Has decreased hearing on the left ear He has been taking Tylenol and ibuprofen as needed for pain Patient encouraged to maintain close follow-up with ENT surgeon he verbalized understanding         Other   Annual physical exam - Primary   Relevant Orders   CBC with Differential   TSH   Vitamin D (25 hydroxy)   CMP14+EGFR   HgB A1c   Lipid Profile   Hepatitis C Antibody   Throat irritation    Strep test was negative Redness and irritation most likely due to his recent ear surgery  follw up with ENT as planned       Relevant Orders   POCT rapid strep A (Completed)   Need for immunization against influenza     Patient educated on CDC recommendation for the vaccine. Verbal consent was obtained from the patient, vaccine administered by nurse, no sign of adverse reactions noted at this time. Patient education on arm soreness and use of tylenol or ibuprofenfor this patient  was discussed. Patient educated on the signs and symptoms of adverse effect and advise to contact the office if they occur      Relevant Orders   Flu Vaccine QUAD 21moIM (Fluarix, Fluzone & Alfiuria Quad PF) (Completed)   Return in about 1 year (around 04/25/2023) for CPE.     FRenee Rival FNP

## 2022-04-24 NOTE — Telephone Encounter (Signed)
Patient had appt 9/8  Forgot to mention thrush symptoms Wants a call back in regard, wants to see if provider can send med in to help

## 2022-04-24 NOTE — Patient Instructions (Signed)

## 2022-04-25 LAB — CBC WITH DIFFERENTIAL/PLATELET
Basophils Absolute: 0.1 10*3/uL (ref 0.0–0.2)
Basos: 1 %
EOS (ABSOLUTE): 0.2 10*3/uL (ref 0.0–0.4)
Eos: 3 %
Hematocrit: 41.5 % (ref 37.5–51.0)
Hemoglobin: 14 g/dL (ref 13.0–17.7)
Immature Grans (Abs): 0.1 10*3/uL (ref 0.0–0.1)
Immature Granulocytes: 2 %
Lymphocytes Absolute: 1.7 10*3/uL (ref 0.7–3.1)
Lymphs: 29 %
MCH: 31 pg (ref 26.6–33.0)
MCHC: 33.7 g/dL (ref 31.5–35.7)
MCV: 92 fL (ref 79–97)
Monocytes Absolute: 0.4 10*3/uL (ref 0.1–0.9)
Monocytes: 7 %
Neutrophils Absolute: 3.5 10*3/uL (ref 1.4–7.0)
Neutrophils: 58 %
Platelets: 343 10*3/uL (ref 150–450)
RBC: 4.51 x10E6/uL (ref 4.14–5.80)
RDW: 12.7 % (ref 11.6–15.4)
WBC: 6.1 10*3/uL (ref 3.4–10.8)

## 2022-04-25 LAB — CMP14+EGFR
ALT: 21 IU/L (ref 0–44)
AST: 22 IU/L (ref 0–40)
Albumin/Globulin Ratio: 2.6 — ABNORMAL HIGH (ref 1.2–2.2)
Albumin: 4.6 g/dL (ref 4.1–5.1)
Alkaline Phosphatase: 64 IU/L (ref 44–121)
BUN/Creatinine Ratio: 18 (ref 9–20)
BUN: 13 mg/dL (ref 6–20)
Bilirubin Total: <0.2 mg/dL (ref 0.0–1.2)
CO2: 24 mmol/L (ref 20–29)
Calcium: 9.7 mg/dL (ref 8.7–10.2)
Chloride: 105 mmol/L (ref 96–106)
Creatinine, Ser: 0.72 mg/dL — ABNORMAL LOW (ref 0.76–1.27)
Globulin, Total: 1.8 g/dL (ref 1.5–4.5)
Glucose: 90 mg/dL (ref 70–99)
Potassium: 4.4 mmol/L (ref 3.5–5.2)
Sodium: 145 mmol/L — ABNORMAL HIGH (ref 134–144)
Total Protein: 6.4 g/dL (ref 6.0–8.5)
eGFR: 124 mL/min/{1.73_m2} (ref >59)

## 2022-04-25 LAB — HEMOGLOBIN A1C
Est. average glucose Bld gHb Est-mCnc: 108 mg/dL
Hgb A1c MFr Bld: 5.4 % (ref 4.8–5.6)

## 2022-04-25 LAB — LIPID PANEL
Chol/HDL Ratio: 4 ratio (ref 0.0–5.0)
Cholesterol, Total: 173 mg/dL (ref 100–199)
HDL: 43 mg/dL (ref >39)
LDL Chol Calc (NIH): 77 mg/dL (ref 0–99)
Triglycerides: 332 mg/dL — ABNORMAL HIGH (ref 0–149)
VLDL Cholesterol Cal: 53 mg/dL — ABNORMAL HIGH (ref 5–40)

## 2022-04-25 LAB — HEPATITIS C ANTIBODY: Hep C Virus Ab: NONREACTIVE

## 2022-04-25 LAB — VITAMIN D 25 HYDROXY (VIT D DEFICIENCY, FRACTURES): Vit D, 25-Hydroxy: 19 ng/mL — ABNORMAL LOW (ref 30.0–100.0)

## 2022-04-25 LAB — TSH: TSH: 1.49 u[IU]/mL (ref 0.450–4.500)

## 2022-04-27 ENCOUNTER — Telehealth: Payer: Self-pay | Admitting: Nurse Practitioner

## 2022-04-27 NOTE — Progress Notes (Signed)
Vitamin D level is low, start vitamin D 1000 units daily .   Sodium level is a little bit elevated, drink at least 64 ounces of water daily to maintain hydration.  Triglycerides is elevated Eat a healthy diet, including lots of fruits and vegetables. Avoid foods with a lot of saturated and trans fats, such as red meat, butter, fried foods and cheese .   Other labs are normal

## 2022-04-27 NOTE — Telephone Encounter (Signed)
Pt called stating he is still having issues with taste.. he now has a white coating all over his mouth and it is somewhat painful. Wants to know what to do or if he can please get something for this? He was just seen Friday 04/24/22.     [facility]

## 2022-04-28 ENCOUNTER — Other Ambulatory Visit: Payer: Self-pay | Admitting: Nurse Practitioner

## 2022-04-28 DIAGNOSIS — B370 Candidal stomatitis: Secondary | ICD-10-CM

## 2022-04-28 MED ORDER — NYSTATIN 100000 UNIT/ML MT SUSP: 5.0000 mL | Freq: Four times a day (QID) | OROMUCOSAL | 0 refills | Status: DC

## 2022-04-28 NOTE — Telephone Encounter (Signed)
Spoke with pt

## 2022-04-29 NOTE — Telephone Encounter (Signed)
Pt has been informed of medication sent in .

## 2022-05-06 ENCOUNTER — Telehealth: Payer: Self-pay

## 2022-05-06 NOTE — Telephone Encounter (Signed)
Patient called still have thrust coming and going quite a bit. Patient said he has taken medicine that was given. Please contact patient [phone].

## 2022-05-07 NOTE — Telephone Encounter (Signed)
Please advise thrush

## 2022-05-07 NOTE — Telephone Encounter (Signed)
Spoke with pt advised of Fola's message pt states that he spoke with ent yesterday and they gave him fluconazole once daily he hopes it will help he just wanted [place] to be aware

## 2022-05-07 NOTE — Telephone Encounter (Signed)
Patient returning call.

## 2022-05-27 ENCOUNTER — Encounter: Payer: Self-pay | Admitting: Internal Medicine

## 2022-05-27 ENCOUNTER — Ambulatory Visit: Payer: BC Managed Care – PPO | Admitting: Internal Medicine

## 2022-05-27 VITALS — BP 119/74 | HR 98 | Ht 72.0 in | Wt 178.4 lb

## 2022-05-27 DIAGNOSIS — B370 Candidal stomatitis: Secondary | ICD-10-CM | POA: Diagnosis not present

## 2022-05-27 DIAGNOSIS — E559 Vitamin D deficiency, unspecified: Secondary | ICD-10-CM | POA: Insufficient documentation

## 2022-05-27 MED ORDER — FLUCONAZOLE 150 MG PO TABS: 150.0000 mg | ORAL_TABLET | Freq: Two times a day (BID) | ORAL | 0 refills | Status: AC

## 2022-05-27 MED ORDER — VITAMIN D (ERGOCALCIFEROL) 1.25 MG (50000 UNIT) PO CAPS: 50000.0000 [IU] | ORAL_CAPSULE | ORAL | 0 refills | Status: AC

## 2022-05-27 NOTE — Assessment & Plan Note (Signed)
Previously treated with Diflucan x2 weeks but he cannot recall the dose.  He has white patches present on the right lateral aspect of the tongue today.  He endorses a metallic taste as well.  His symptoms remain most consistent with oropharyngeal candidiasis.  I have prescribed fluconazole 150 mg twice daily x14 days.   -Follow-up in 2 weeks for reassessment

## 2022-05-27 NOTE — Progress Notes (Signed)
 Established Patient Office Visit  Subjective   [name], male    DOB: 10/28/1990  Age: 31 y.o. MRN: [id number]  Chief Complaint  Patient presents with   Follow-up   Matthew Andrews returns to care today.  He was last seen by Folashade Paseda, NP on 9/8 for his annual physical exam.  At that appointment he reported oral and throat irritation.  He has ultimately been treated for oropharyngeal candidiasis.  He recently completed a 2-week course of fluconazole but cannot recall the dose of fluconazole that he was prescribed.  Of note, he underwent surgery on his left ear in August for resection of a cholesteatoma.  This was performed by an ENT in [place].  He believes that his oropharyngeal symptoms are related to his recent surgery.  Matthew Andrews states that his oral discomfort is gradually returning.  He endorses a copper taste in his mouth and states that he has discomfort on his tongue, but not frank pain.  He denies any odynophagia or dysphagia.  He additionally denies fever/chills.  He is not currently taking any additional medications.  Past Medical History:  Diagnosis Date   Anxiety    Cholesteatoma of ear, left 2023   COVID-19 04/05/2020   RUQ pain 08/03/2019   Added automatically from request for surgery [id number]   Past Surgical History:  Procedure Laterality Date   BIOPSY  08/31/2019   Procedure: BIOPSY;  Surgeon: Rehman, Najeeb U, MD;  Location: [facility];  Service: Endoscopy;;  duodenum, gastric   ESOPHAGOGASTRODUODENOSCOPY N/A 08/31/2019   Procedure: ESOPHAGOGASTRODUODENOSCOPY (EGD);  Surgeon: Rehman, Najeeb U, MD;  Location: [facility];  Service: Endoscopy;  Laterality: N/A;  2:50   knee surgery Left    LUMBAR SPINE SURGERY     2014   MIDDLE EAR SURGERY Left 2023   Social History   Tobacco Use   Smoking status: Never    Passive exposure: Never   Smokeless tobacco: Never  Vaping Use   Vaping Use: Never used  Substance Use Topics   Alcohol use: Yes     Comment: occasional   Drug use: Never   Family History  Problem Relation Age of Onset   Skin cancer Mother    Melanoma Father    No Known Allergies    Review of Systems  HENT:         Copper taste, tongue discomfort  All other systems reviewed and are negative.     Objective:     BP 119/74   Pulse 98   Ht 6' (1.829 m)   Wt 178 lb 6.4 oz (80.9 kg)   SpO2 96%   BMI 24.20 kg/m    Physical Exam Vitals reviewed.  Constitutional:      General: He is not in acute distress.    Appearance: Normal appearance. He is not ill-appearing.  HENT:     Head: Normocephalic and atraumatic.     Nose: Nose normal. No congestion or rhinorrhea.     Mouth/Throat:     Mouth: Mucous membranes are moist.     Comments: There are white patches on the right, lateral aspect of the tongue Eyes:     Extraocular Movements: Extraocular movements intact.     Conjunctiva/sclera: Conjunctivae normal.     Pupils: Pupils are equal, round, and reactive to light.  Cardiovascular:     Rate and Rhythm: Normal rate and regular rhythm.     Pulses: Normal pulses.     Heart   sounds: Normal heart sounds. No murmur heard. Pulmonary:     Effort: Pulmonary effort is normal.     Breath sounds: Normal breath sounds. No wheezing, rhonchi or rales.  Abdominal:     General: Abdomen is flat. Bowel sounds are normal. There is no distension.     Palpations: Abdomen is soft.     Tenderness: There is no abdominal tenderness.  Musculoskeletal:        General: No swelling or deformity. Normal range of motion.     Cervical back: Normal range of motion.  Skin:    General: Skin is warm and dry.     Capillary Refill: Capillary refill takes less than 2 seconds.  Neurological:     General: No focal deficit present.     Mental Status: He is alert and oriented to person, place, and time.     Motor: No weakness.  Psychiatric:        Mood and Affect: Mood normal.        Behavior: Behavior normal.        Thought  Content: Thought content normal.    Last CBC Lab Results  Component Value Date   WBC 6.1 04/24/2022   HGB 14.0 04/24/2022   HCT 41.5 04/24/2022   MCV 92 04/24/2022   MCH 31.0 04/24/2022   RDW 12.7 04/24/2022   PLT 343 04/24/2022   Last metabolic panel Lab Results  Component Value Date   GLUCOSE 90 04/24/2022   NA 145 (H) 04/24/2022   K 4.4 04/24/2022   CL 105 04/24/2022   CO2 24 04/24/2022   BUN 13 04/24/2022   CREATININE 0.72 (L) 04/24/2022   EGFR 124 04/24/2022   CALCIUM 9.7 04/24/2022   PROT 6.4 04/24/2022   ALBUMIN 4.6 04/24/2022   LABGLOB 1.8 04/24/2022   AGRATIO 2.6 (H) 04/24/2022   BILITOT <0.2 04/24/2022   ALKPHOS 64 04/24/2022   AST 22 04/24/2022   ALT 21 04/24/2022   Last lipids Lab Results  Component Value Date   CHOL 173 04/24/2022   HDL 43 04/24/2022   LDLCALC 77 04/24/2022   TRIG 332 (H) 04/24/2022   CHOLHDL 4.0 04/24/2022     Assessment & Plan:   Problem List Items Addressed This Visit       Oropharyngeal candidiasis - Primary    Previously treated with Diflucan x2 weeks but he cannot recall the dose.  He has white patches present on the right lateral aspect of the tongue today.  He endorses a metallic taste as well.  His symptoms remain most consistent with oropharyngeal candidiasis.  I have prescribed fluconazole 150 mg twice daily x14 days.   -Follow-up in 2 weeks for reassessment      Relevant Medications   fluconazole (DIFLUCAN) 150 MG tablet   Vitamin D deficiency    Noted on most recent labs.  I have prescribed high-dose vitamin D supplementation x12 weeks today.      Relevant Medications   Vitamin D, Ergocalciferol, (DRISDOL) 1.25 MG (50000 UNIT) CAPS capsule    Return in about 2 weeks (around 06/10/2022).    Phillip E Dixon, MD  

## 2022-05-27 NOTE — Patient Instructions (Signed)
It was a pleasure to see you today.  Thank you for giving [place] the opportunity to be involved in your care.  Below is a brief recap of your visit and next steps.  We will plan to see you again in 2 weeks.  Summary I have prescribed fluconazole 150 mg 2 times daily x 14 days. I will prescribe high dose vitamin D supplementation Follow up in 2 weeks

## 2022-05-27 NOTE — Assessment & Plan Note (Signed)
Noted on most recent labs.  I have prescribed high-dose vitamin D supplementation x12 weeks today.

## 2022-06-10 ENCOUNTER — Encounter: Payer: Self-pay | Admitting: Internal Medicine

## 2022-06-10 ENCOUNTER — Ambulatory Visit: Payer: BC Managed Care – PPO | Admitting: Internal Medicine

## 2022-06-10 VITALS — BP 123/77 | HR 83 | Ht 72.0 in | Wt 172.2 lb

## 2022-06-10 DIAGNOSIS — B370 Candidal stomatitis: Secondary | ICD-10-CM | POA: Diagnosis not present

## 2022-06-10 NOTE — Assessment & Plan Note (Signed)
Symptoms improving since his last appointment.  He has only taken 5 days of fluconazole at the current dose due to a delay in filling the prescription.  He currently denies a metallic taste.  On exam there are small white patches noted on the back of the tongue.  These are able to be removed with a tongue depressor. -No change in treatment plan for now.  Complete 14-day course of fluconazole 150 mg twice daily -We will schedule a telephone appointment for 1 week to reassess his symptoms.  If there is no further resolution.  Consider treatment with an -azole oral solution.

## 2022-06-10 NOTE — Progress Notes (Signed)
Established Patient Office Visit  Subjective   [name], male    DOB: 1991-02-27  Age: 31 y.o. MRN: [id number]  Chief Complaint  Patient presents with   Follow-up   Matthew Andrews returns to care today.  He is a 31 year old male last seen by me on 10/11 for evaluation of oropharyngeal candidiasis.  At his last appointment he reported persistent symptoms despite completing a 2-week course of fluconazole recently.  He could not recall the dose that he was previously prescribed.  I prescribed him fluconazole 150 mg twice daily x14 days and 2-week follow-up was arranged.  Today Matthew Andrews states that his symptoms seem to be improving.  He still has some irritation and notices white spots on the back of his tongue. He notes that there was a delay in him being able to fill his prescription due to medication shortage.  He has currently taken 5 days of fluconazole at the current dose.  He still has 9 days of treatment remaining.  He has not noted any worsening of his symptoms.  He previously endorsed a copper taste, but states that this has improved.  He continues to deny odynophagia and dysphagia.  Past Medical History:  Diagnosis Date   Anxiety    Cholesteatoma of ear, left 2023   COVID-19 04/05/2020   RUQ pain 08/03/2019   Added automatically from request for surgery [id number]   Past Surgical History:  Procedure Laterality Date   BIOPSY  08/31/2019   Procedure: BIOPSY;  Surgeon: Rogene Houston, MD;  Location: [facility];  Service: Endoscopy;;  duodenum, gastric   ESOPHAGOGASTRODUODENOSCOPY N/A 08/31/2019   Procedure: ESOPHAGOGASTRODUODENOSCOPY (EGD);  Surgeon: Rogene Houston, MD;  [facility]: [facility];  Service: Endoscopy;  Laterality: N/A;  2:50   knee surgery Left    LUMBAR SPINE SURGERY     2014   MIDDLE EAR SURGERY Left 2023   Social History   Tobacco Use   Smoking status: Never    Passive exposure: Never   Smokeless tobacco: Never  Vaping Use   Vaping Use:  Never used  Substance Use Topics   Alcohol use: Yes    Comment: occasional   Drug use: Never   Family History  Problem Relation Age of Onset   Skin cancer Mother    Melanoma Father    No Known Allergies  Review of Systems  HENT:         Irritation and white spots on back of tongue     Objective:     BP 123/77   Pulse 83   Ht 6' (1.829 m)   Wt 172 lb 3.2 oz (78.1 kg)   SpO2 97%   BMI 23.35 kg/m   Physical Exam Vitals reviewed.  Constitutional:      General: He is not in acute distress.    Appearance: Normal appearance. He is not ill-appearing.  HENT:     Head: Normocephalic and atraumatic.     Nose: Nose normal. No congestion or rhinorrhea.     Mouth/Throat:     Mouth: Mucous membranes are moist.     Comments: Small white patches noted at base of tongue that are able to be scraped off with a tongue depressor Eyes:     Extraocular Movements: Extraocular movements intact.     Conjunctiva/sclera: Conjunctivae normal.     Pupils: Pupils are equal, round, and reactive to light.  Cardiovascular:     Rate and Rhythm: Normal rate and  regular rhythm.     Pulses: Normal pulses.     Heart sounds: Normal heart sounds. No murmur heard. Pulmonary:     Effort: Pulmonary effort is normal.     Breath sounds: Normal breath sounds. No wheezing, rhonchi or rales.  Abdominal:     General: Abdomen is flat. Bowel sounds are normal. There is no distension.     Palpations: Abdomen is soft.     Tenderness: There is no abdominal tenderness.  Musculoskeletal:        General: No swelling or deformity. Normal range of motion.     Cervical back: Normal range of motion.  Skin:    General: Skin is warm and dry.     Capillary Refill: Capillary refill takes less than 2 seconds.  Neurological:     General: No focal deficit present.     Mental Status: He is alert and oriented to person, place, and time.     Motor: No weakness.  Psychiatric:        Mood and Affect: Mood normal.         Behavior: Behavior normal.        Thought Content: Thought content normal.    Last CBC Lab Results  Component Value Date   WBC 6.1 04/24/2022   HGB 14.0 04/24/2022   HCT 41.5 04/24/2022   MCV 92 04/24/2022   MCH 31.0 04/24/2022   RDW 12.7 04/24/2022   PLT 343 79/09/4095   Last metabolic panel Lab Results  Component Value Date   GLUCOSE 90 04/24/2022   NA 145 (H) 04/24/2022   K 4.4 04/24/2022   CL 105 04/24/2022   CO2 24 04/24/2022   BUN 13 04/24/2022   CREATININE 0.72 (L) 04/24/2022   EGFR 124 04/24/2022   CALCIUM 9.7 04/24/2022   PROT 6.4 04/24/2022   ALBUMIN 4.6 04/24/2022   LABGLOB 1.8 04/24/2022   AGRATIO 2.6 (H) 04/24/2022   BILITOT <0.2 04/24/2022   ALKPHOS 64 04/24/2022   AST 22 04/24/2022   ALT 21 04/24/2022   Last lipids Lab Results  Component Value Date   CHOL 173 04/24/2022   HDL 43 04/24/2022   LDLCALC 77 04/24/2022   TRIG 332 (H) 04/24/2022   CHOLHDL 4.0 04/24/2022   Last hemoglobin A1c Lab Results  Component Value Date   HGBA1C 5.4 04/24/2022   Last thyroid functions Lab Results  Component Value Date   TSH 1.490 04/24/2022   Last vitamin D Lab Results  Component Value Date   VD25OH 19.0 (L) 04/24/2022     Assessment & Plan:   Problem List Items Addressed This Visit       Oropharyngeal candidiasis - Primary    Symptoms improving since his last appointment.  He has only taken 5 days of fluconazole at the current dose due to a delay in filling the prescription.  He currently denies a metallic taste.  On exam there are small white patches noted on the back of the tongue.  These are able to be removed with a tongue depressor. -No change in treatment plan for now.  Complete 14-day course of fluconazole 150 mg twice daily -We will schedule a telephone appointment for 1 week to reassess his symptoms.  If there is no further resolution.  Consider treatment with an -azole oral solution.       Return in about 1 week (around 06/17/2022).     Johnette Abraham, MD

## 2022-06-10 NOTE — Patient Instructions (Signed)
It was a pleasure to see you today.  Thank you for giving [place] the opportunity to be involved in your care.  Below is a brief recap of your visit and next steps.  We will plan to see you again in 1 week for telephone encounter  Summary Finish diflucan and we will have a phone call appointment to discuss next week if there is no improvement

## 2022-06-17 ENCOUNTER — Encounter: Payer: Self-pay | Admitting: Internal Medicine

## 2022-06-17 ENCOUNTER — Ambulatory Visit (INDEPENDENT_AMBULATORY_CARE_PROVIDER_SITE_OTHER): Payer: BC Managed Care – PPO | Admitting: Internal Medicine

## 2022-06-17 DIAGNOSIS — B370 Candidal stomatitis: Secondary | ICD-10-CM

## 2022-06-17 MED ORDER — ITRACONAZOLE 10 MG/ML PO SOLN: 200.0000 mg | Freq: Every day | ORAL | 1 refills | Status: AC

## 2022-06-17 NOTE — Addendum Note (Signed)
Addended by: Johny Drilling on: 06/17/2022 04:53 PM   Modules accepted: Orders

## 2022-06-17 NOTE — Progress Notes (Signed)
   Telephone Visit  Virtual Visit via Telephone Note  I connected with Stephani Police on 06/17/22 at  4:40 PM EDT by telephone and verified that I am speaking with the correct person using two identifiers.  Location: Patient: [address], [place], [place] [address] Provider: [phone] S. [address] [place], [place] [address]   I discussed the limitations, risks, security and privacy concerns of performing an evaluation and management service by telephone and the availability of in person appointments. I also discussed with the patient that there may be a patient responsible charge related to this service. The patient expressed understanding and agreed to proceed.   History of Present Illness:  Mr. Kunath is evaluated today via telephone encounter for oropharyngeal candidiasis.  He was last seen by me on 10/25 for persistent symptoms of oropharyngeal candidiasis.  I had previously seen him on 10/11 and represcribed fluconazole 150 mg twice daily x14 days.  He reported a delay in being able to fill the prescription and stated that he was currently on day 5.  He has now completed his course of fluconazole and states that his symptoms are largely unchanged.  He continues to report white patches on the back of his tongue and has a metallic taste.  He states that the white patches are able to be removed with brushing his tongue with his toothbrush, however they seem to return the following day.  He continues to deny odynophagia and fever/chills.  Assessment and Plan:  Oropharyngeal candidiasis Persistent symptoms despite 2 rounds of treatment with systemic fluconazole.  We reviewed treatment options today.  I recommended that he present for fungal culture collection.  I have also prescribed itraconazole oral solution 200 mg daily x14 days.  This can be extended up to 28 days if there is no resolution.  He is in agreement with this plan.  Follow Up Instructions:  I discussed the assessment and treatment plan with the  patient. The patient was provided an opportunity to ask questions and all were answered. The patient agreed with the plan and demonstrated an understanding of the instructions.   The patient was advised to call back or seek an in-person evaluation if the symptoms worsen or if the condition fails to improve as anticipated.  I provided 7 minutes of non-face-to-face time during this encounter.   Johnette Abraham, MD

## 2022-06-20 LAB — FUNGUS STAIN

## 2022-06-27 ENCOUNTER — Encounter (INDEPENDENT_AMBULATORY_CARE_PROVIDER_SITE_OTHER): Payer: Self-pay | Admitting: Gastroenterology

## 2022-07-13 DIAGNOSIS — H90A12 Conductive hearing loss, unilateral, left ear with restricted hearing on the contralateral side: Secondary | ICD-10-CM | POA: Diagnosis not present

## 2022-07-13 DIAGNOSIS — H7102 Cholesteatoma of attic, left ear: Secondary | ICD-10-CM | POA: Diagnosis not present

## 2022-07-25 DIAGNOSIS — J069 Acute upper respiratory infection, unspecified: Secondary | ICD-10-CM | POA: Diagnosis not present

## 2022-07-25 DIAGNOSIS — R519 Headache, unspecified: Secondary | ICD-10-CM | POA: Diagnosis not present

## 2022-07-25 DIAGNOSIS — Z20822 Contact with and (suspected) exposure to covid-19: Secondary | ICD-10-CM | POA: Diagnosis not present

## 2022-07-27 ENCOUNTER — Ambulatory Visit: Payer: BC Managed Care – PPO | Admitting: Internal Medicine

## 2022-07-29 ENCOUNTER — Ambulatory Visit: Payer: BC Managed Care – PPO | Admitting: Internal Medicine

## 2022-09-04 ENCOUNTER — Telehealth: Payer: Self-pay | Admitting: Internal Medicine

## 2022-09-04 NOTE — Telephone Encounter (Signed)
Pt called stating that he thinks he may have a pinched nerve in neck. Having issues with this. Wants to get a call back when Dr. Doren Custard is in office?

## 2022-09-07 NOTE — Telephone Encounter (Signed)
Returned pt call  

## 2022-09-11 ENCOUNTER — Encounter: Payer: Self-pay | Admitting: Internal Medicine

## 2022-09-11 ENCOUNTER — Ambulatory Visit: Payer: BC Managed Care – PPO | Admitting: Internal Medicine

## 2022-09-11 VITALS — BP 129/85 | HR 92 | Ht 72.0 in | Wt 176.0 lb

## 2022-09-11 DIAGNOSIS — J4520 Mild intermittent asthma, uncomplicated: Secondary | ICD-10-CM

## 2022-09-11 DIAGNOSIS — J309 Allergic rhinitis, unspecified: Secondary | ICD-10-CM | POA: Insufficient documentation

## 2022-09-11 DIAGNOSIS — Z23 Encounter for immunization: Secondary | ICD-10-CM | POA: Diagnosis not present

## 2022-09-11 DIAGNOSIS — R1011 Right upper quadrant pain: Secondary | ICD-10-CM

## 2022-09-11 DIAGNOSIS — J3089 Other allergic rhinitis: Secondary | ICD-10-CM | POA: Diagnosis not present

## 2022-09-11 DIAGNOSIS — J45909 Unspecified asthma, uncomplicated: Secondary | ICD-10-CM | POA: Insufficient documentation

## 2022-09-11 MED ORDER — ALBUTEROL SULFATE HFA 108 (90 BASE) MCG/ACT IN AERS: 2.0000 | INHALATION_SPRAY | Freq: Four times a day (QID) | RESPIRATORY_TRACT | 0 refills | Status: AC | PRN

## 2022-09-11 MED ORDER — CETIRIZINE HCL 10 MG PO TABS: 10.0000 mg | ORAL_TABLET | Freq: Every day | ORAL | 11 refills | Status: DC

## 2022-09-11 NOTE — Progress Notes (Signed)
Acute Office Visit  Subjective:    [name], male    DOB: 11-Jul-1991, 32 y.o.   MRN: [id number]  Chief Complaint  Patient presents with   Shortness of Breath    Patient states for a week now, he has noticed him becoming short of breath or labored breathing even with regular activities. He had back and stomach pain that he has never felt before.    HPI Patient is in today for complaint of dyspnea upon mild exertion and cough since yesterday.  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  He has had mild, but chronic dyspnea, but has been worse recently.  Denies any fever or chills.  He has chronic nasal congestion and postnasal drip as well.  He also reports chronic, intermittent right upper quadrant abdominal pain.  He has not had RUQ ultrasound in the past (07/2019), which was unremarkable.  He has also had EGD, which showed gastritis.  He has episodes of nausea and vomiting on an intermittent basis as well.  Denies any hematemesis.  Denies any melena or hematochezia currently.  Past Medical History:  Diagnosis Date   Anxiety    Cholesteatoma of ear, left 2023   COVID-19 04/05/2020   RUQ pain 08/03/2019   Added automatically from request for surgery [id number]    Past Surgical History:  Procedure Laterality Date   BIOPSY  08/31/2019   Procedure: BIOPSY;  Surgeon: Rogene Houston, MD;  Location: [facility];  Service: Endoscopy;;  duodenum, gastric   ESOPHAGOGASTRODUODENOSCOPY N/A 08/31/2019   Procedure: ESOPHAGOGASTRODUODENOSCOPY (EGD);  Surgeon: Rogene Houston, MD;  Location: [facility];  Service: Endoscopy;  Laterality: N/A;  2:50   knee surgery Left    LUMBAR SPINE SURGERY     2014   MIDDLE EAR SURGERY Left 2023    Family History  Problem Relation Age of Onset   Skin cancer Mother    Melanoma Father     Social History   Socioeconomic History   Marital status: Married    Spouse name: Brydan Downard     Number of children: 2   Years of education: Not on file   Highest education level: High school graduate  Occupational History   Occupation: Pensions consultant    Comment: tree trimmer   Tobacco Use   Smoking status: Never    Passive exposure: Never   Smokeless tobacco: Never  Vaping Use   Vaping Use: Never used  Substance and Sexual Activity   Alcohol use: Yes    Comment: occasional   Drug use: Never   Sexual activity: Yes  Other Topics Concern   Not on file  Social History Narrative   Lives with wife Jarrett Soho married for 4 years  dated for 5 years before    2 children   Daughter 69- 8    Son 2- Biochemist, clinical       Pets: 2 dogs: American Bully: Cleo   Mutt: Dotson Mix: Pluto       Enjoys: watching football, outside, family and friends      Diet: getting better with his diet    Water: 1 bottle a day   Caffiene: tea 2-3       Wears seat belt    Wears sun protection   Smoke detectors    Does not use phone while driving   Social Determinants of Health   Financial Resource Strain: Low Risk  (08/02/2020)  Overall Financial Resource Strain (CARDIA)    Difficulty of Paying Living Expenses: Not hard at all  Food Insecurity: No Food Insecurity (08/02/2020)   Hunger Vital Sign    Worried About Running Out of Food in the Last Year: Never true    Ran Out of Food in the Last Year: Never true  Transportation Needs: No Transportation Needs (08/02/2020)   PRAPARE - Administrator, Civil Service (Medical): No    Lack of Transportation (Non-Medical): No  Physical Activity: Sufficiently Active (08/02/2020)   Exercise Vital Sign    Days of Exercise per Week: 7 days    Minutes of Exercise per Session: 60 min  Stress: No Stress Concern Present (08/02/2020)   [employer] of Occupational Health - Occupational Stress Questionnaire    Feeling of Stress : Not at all  Social Connections: Moderately Integrated (08/02/2020)   Social Connection and Isolation Panel [NHANES]     Frequency of Communication with Friends and Family: More than three times a week    Frequency of Social Gatherings with Friends and Family: Once a week    Attends Religious Services: More than 4 times per year    Active Member of [employer] or Organizations: No    Attends Banker Meetings: Never    Marital Status: Married  Catering manager Violence: Not At Risk (08/02/2020)   Humiliation, Afraid, Rape, and Kick questionnaire    Fear of Current or Ex-Partner: No    Emotionally Abused: No    Physically Abused: No    Sexually Abused: No    No outpatient medications prior to visit.   No facility-administered medications prior to visit.    No Known Allergies  Review of Systems  Constitutional:  Negative for chills and fever.  HENT:  Positive for congestion and postnasal drip. Negative for sore throat.   Eyes:  Negative for pain and discharge.  Respiratory:  Positive for shortness of breath. Negative for cough.   Cardiovascular:  Negative for chest pain and palpitations.  Gastrointestinal:  Positive for abdominal pain, nausea and vomiting.  Endocrine: Negative for polydipsia and polyuria.  Genitourinary:  Negative for dysuria and hematuria.  Musculoskeletal:  Negative for neck pain and neck stiffness.  Skin:  Negative for rash.  Neurological:  Negative for dizziness, weakness, numbness and headaches.  Psychiatric/Behavioral:  Negative for agitation and behavioral problems.        Objective:    Physical Exam Vitals reviewed.  Constitutional:      General: He is not in acute distress.    Appearance: He is not diaphoretic.  HENT:     Head: Normocephalic and atraumatic.     Nose: Nose normal.     Mouth/Throat:     Mouth: Mucous membranes are moist.  Eyes:     General: No scleral icterus.    Extraocular Movements: Extraocular movements intact.  Cardiovascular:     Rate and Rhythm: Normal rate and regular rhythm.     Heart sounds: Normal heart sounds. No murmur  heard. Pulmonary:     Breath sounds: Normal breath sounds. No wheezing or rales.  Abdominal:     Palpations: Abdomen is soft.     Tenderness: There is no abdominal tenderness.  Musculoskeletal:     Cervical back: Neck supple. No tenderness.     Right lower leg: No edema.     Left lower leg: No edema.  Skin:    General: Skin is warm.     Findings: No rash.  Neurological:     General: No focal deficit present.     Mental Status: He is alert and oriented to person, place, and time.  Psychiatric:        Mood and Affect: Mood normal.        Behavior: Behavior normal.     BP 129/85 (BP Location: Right Arm, Patient Position: Sitting, Cuff Size: Normal)   Pulse 92   Ht 6' (1.829 m)   Wt 176 lb (79.8 kg)   SpO2 98%   BMI 23.87 kg/m  Wt Readings from Last 3 Encounters:  09/11/22 176 lb (79.8 kg)  06/10/22 172 lb 3.2 oz (78.1 kg)  05/27/22 178 lb 6.4 oz (80.9 kg)        Assessment & Plan:   Problem List Items Addressed This Visit       Respiratory   Reactive airway disease - Primary    His symptoms could be due to allergies/mild reactive airway disease His occupation puts him at risk of having an allergic reaction Albuterol as needed for dyspnea or wheezing for now Started Zyrtec as needed for allergies Advised to use Flonase for nasal congestion      Relevant Medications   albuterol (VENTOLIN HFA) 108 (90 Base) MCG/ACT inhaler   Allergic rhinitis    Started Zyrtec as needed for allergies Advised to use Flonase for nasal congestion      Relevant Medications   cetirizine (ZYRTEC) 10 MG tablet     Other   RUQ pain    Has chronic RUQ abdominal pain with intermittent nausea and vomiting Concerning for gallbladder etiology - check [place] RUQ abdomen      Relevant Orders   US Abdomen Limited RUQ (LIVER/GB)   Other Visit Diagnoses     Need for tetanus booster       Relevant Orders   Tdap vaccine greater than or equal to 7yo IM (Completed)        Meds ordered  this encounter  Medications   albuterol (VENTOLIN HFA) 108 (90 Base) MCG/ACT inhaler    Sig: Inhale 2 puffs into the lungs every 6 (six) hours as needed for wheezing or shortness of breath.    Dispense:  18 g    Refill:  0    Okay to substitute to generic/formulary Albuterol.   cetirizine (ZYRTEC) 10 MG tablet    Sig: Take 1 tablet (10 mg total) by mouth daily.    Dispense:  30 tablet    Refill:  11     Billy Turvey Keith Rake, MD

## 2022-09-11 NOTE — Assessment & Plan Note (Signed)
His symptoms could be due to allergies/mild reactive airway disease His occupation puts him at risk of having an allergic reaction Albuterol as needed for dyspnea or wheezing for now Started Zyrtec as needed for allergies Advised to use Flonase for nasal congestion

## 2022-09-11 NOTE — Patient Instructions (Addendum)
Please use Albuterol as needed for shortness of breath or wheezing.  Please take Zyrtec as prescribed for allergies.

## 2022-09-11 NOTE — Assessment & Plan Note (Signed)
Has chronic RUQ abdominal pain with intermittent nausea and vomiting Concerning for gallbladder etiology - check [place] RUQ abdomen

## 2022-09-11 NOTE — Assessment & Plan Note (Signed)
Started Zyrtec as needed for allergies Advised to use Flonase for nasal congestion

## 2022-09-24 ENCOUNTER — Ambulatory Visit (HOSPITAL_COMMUNITY): Payer: BC Managed Care – PPO

## 2022-09-30 ENCOUNTER — Ambulatory Visit (HOSPITAL_COMMUNITY): Payer: BC Managed Care – PPO

## 2022-10-08 ENCOUNTER — Ambulatory Visit (HOSPITAL_COMMUNITY)
Admission: RE | Admit: 2022-10-08 | Discharge: 2022-10-08 | Disposition: A | Discharge status: Home / Self Care | Payer: BC Managed Care – PPO | Source: Ambulatory Visit | Attending: Internal Medicine | Admitting: Internal Medicine

## 2022-10-08 DIAGNOSIS — R1011 Right upper quadrant pain: Secondary | ICD-10-CM | POA: Diagnosis not present

## 2022-10-15 ENCOUNTER — Encounter: Payer: Self-pay | Admitting: Radiology

## 2022-11-18 ENCOUNTER — Telehealth: Payer: Self-pay | Admitting: Internal Medicine

## 2022-11-18 DIAGNOSIS — J301 Allergic rhinitis due to pollen: Secondary | ICD-10-CM

## 2022-11-18 MED ORDER — FLUTICASONE PROPIONATE 50 MCG/ACT NA SUSP: 2.0000 | Freq: Every day | NASAL | 6 refills | Status: AC

## 2022-11-18 NOTE — Telephone Encounter (Signed)
Pt is wanting to know If he can please get a nose spray & allergy med    [facility]

## 2022-11-19 NOTE — Telephone Encounter (Signed)
Spoke with pt

## 2022-11-23 ENCOUNTER — Encounter: Payer: Self-pay | Admitting: Internal Medicine

## 2022-11-23 ENCOUNTER — Ambulatory Visit: Payer: BC Managed Care – PPO | Admitting: Internal Medicine

## 2022-11-23 VITALS — BP 129/79 | HR 102 | Resp 16 | Ht 72.0 in | Wt 179.0 lb

## 2022-11-23 DIAGNOSIS — R1030 Lower abdominal pain, unspecified: Secondary | ICD-10-CM

## 2022-11-23 NOTE — Patient Instructions (Signed)
Thank you, Mr.Sarim TRAD TIMPE for allowing [place] to provide your care today.   I have ordered the following labs for you:   Lab Orders         Calprotectin, Fecal         CMP14+EGFR         Lipase         Fe+TIBC+Fer         CBC with Differential         Gliadin IgA+tTG IgA        Reminders: I will follow up with results and give your recommendations .     Thurmon Fair, M.D.

## 2022-11-23 NOTE — Progress Notes (Signed)
   HPI:[name] is a 32 y.o. male who presents for evaluation of abdominal pain. The patient and his wife vomited on Friday morning after eating hamburgers and french fries from a restaurant Thursday night. Friday evening he started having abdominal pain. Pain has progressed since Friday and today is the worst. Today he picked up something this am and it was a pulling snapping feeling in stomach. Tenderness to touch and any kind of movement is very painful. Has not felt any lumps or bulges. Not associated with eating. No changes in bowel movements and pain not associated with meals. Normal appetite. He had endoscopy 3 years ago for chronic right upper quadrant abdominal pain. This pain is different. No pain in his testicles. . For the details of today's visit, please refer to the assessment and plan.   Physical Exam: Vitals:   11/23/22 1533  BP: 129/79  Pulse: (!) 102  Resp: 16  SpO2: 95%  Weight: 179 lb (81.2 kg)  Height: 6' (1.829 m)     Physical Exam Constitutional:      General: He is not in acute distress.    Appearance: He is not ill-appearing.  Cardiovascular:     Rate and Rhythm: Regular rhythm. Tachycardia present.  Abdominal:     General: Abdomen is flat. Bowel sounds are normal.     Palpations: Abdomen is soft. There is no mass.     Tenderness: There is abdominal tenderness in the periumbilical area and left lower quadrant. There is no left CVA tenderness or rebound. Negative signs include Murphy's sign, Rovsing's sign and McBurney's sign.  Skin:    General: Skin is warm.     Findings: No rash.      Assessment & Plan:   Matthew Andrews was seen today for abdominal pain.  Lower abdominal pain Assessment & Plan: Patient has abdominal pain in his lower left quadrant and it is migrated to under his umbilicus.  Review of previous endoscopy 3 years ago showed gastritis.  His daughter is undergoing workup for Crohn's disease.  No other family members with bowel disease or  colon cancer.  Will further workup with laboratories as below.  Patient given precautions if pain worsens to go to the emergency room or follow-up with our office if not emergent.  Will give recommendations based on workup.  Orders: -     CMP14+EGFR -     Lipase -     Iron, TIBC and Ferritin Panel -     CBC with Differential/Platelet -     Gliadin IgA+tTG IgA -     Calprotectin, Fecal      Milus Banister, MD

## 2022-11-23 NOTE — Assessment & Plan Note (Signed)
Patient has abdominal pain in his lower left quadrant and it is migrated to under his umbilicus.  Review of previous endoscopy 3 years ago showed gastritis.  His daughter is undergoing workup for Crohn's disease.  No other family members with bowel disease or colon cancer.  Will further workup with laboratories as below.  Patient given precautions if pain worsens to go to the emergency room or follow-up with our office if not emergent.  Will give recommendations based on workup.

## 2022-11-25 LAB — CBC WITH DIFFERENTIAL/PLATELET
Basophils Absolute: 0.1 10*3/uL (ref 0.0–0.2)
Basos: 1 %
EOS (ABSOLUTE): 0 10*3/uL (ref 0.0–0.4)
Eos: 0 %
Hematocrit: 41.9 % (ref 37.5–51.0)
Hemoglobin: 13.9 g/dL (ref 13.0–17.7)
Immature Grans (Abs): 0 10*3/uL (ref 0.0–0.1)
Immature Granulocytes: 0 %
Lymphocytes Absolute: 1.8 10*3/uL (ref 0.7–3.1)
Lymphs: 25 %
MCH: 30.7 pg (ref 26.6–33.0)
MCHC: 33.2 g/dL (ref 31.5–35.7)
MCV: 93 fL (ref 79–97)
Monocytes Absolute: 0.4 10*3/uL (ref 0.1–0.9)
Monocytes: 6 %
Neutrophils Absolute: 4.8 10*3/uL (ref 1.4–7.0)
Neutrophils: 68 %
Platelets: 288 10*3/uL (ref 150–450)
RBC: 4.53 x10E6/uL (ref 4.14–5.80)
RDW: 12.7 % (ref 11.6–15.4)
WBC: 7.1 10*3/uL (ref 3.4–10.8)

## 2022-11-25 LAB — CMP14+EGFR
ALT: 23 IU/L (ref 0–44)
AST: 22 IU/L (ref 0–40)
Albumin/Globulin Ratio: 2 (ref 1.2–2.2)
Albumin: 4.7 g/dL (ref 4.1–5.1)
Alkaline Phosphatase: 81 IU/L (ref 44–121)
BUN/Creatinine Ratio: 17 (ref 9–20)
BUN: 15 mg/dL (ref 6–20)
Bilirubin Total: <0.2 mg/dL (ref 0.0–1.2)
CO2: 25 mmol/L (ref 20–29)
Calcium: 10 mg/dL (ref 8.7–10.2)
Chloride: 102 mmol/L (ref 96–106)
Creatinine, Ser: 0.89 mg/dL (ref 0.76–1.27)
Globulin, Total: 2.3 g/dL (ref 1.5–4.5)
Glucose: 108 mg/dL — ABNORMAL HIGH (ref 70–99)
Potassium: 3.8 mmol/L (ref 3.5–5.2)
Sodium: 144 mmol/L (ref 134–144)
Total Protein: 7 g/dL (ref 6.0–8.5)
eGFR: 117 mL/min/{1.73_m2} (ref >59)

## 2022-11-25 LAB — IRON,TIBC AND FERRITIN PANEL
Ferritin: 59 ng/mL (ref 30–400)
Iron Saturation: 16 % (ref 15–55)
Iron: 64 ug/dL (ref 38–169)
Total Iron Binding Capacity: 405 ug/dL (ref 250–450)
UIBC: 341 ug/dL (ref 111–343)

## 2022-11-25 LAB — GLIADIN IGA+TTG IGA
Antigliadin Abs, IgA: 3 units (ref 0–19)
Transglutaminase IgA: <2 U/mL (ref 0–3)

## 2022-11-25 LAB — LIPASE: Lipase: 36 U/L (ref 13–78)

## 2022-12-08 ENCOUNTER — Telehealth: Payer: Self-pay | Admitting: Internal Medicine

## 2022-12-08 NOTE — Telephone Encounter (Signed)
Patient called to let Dr Barbaraann Faster know he has not forgotten about his stool sample, has been having trouble going to the restroom, but will get a stool sample to office soon.

## 2022-12-11 ENCOUNTER — Telehealth: Payer: Self-pay | Admitting: Internal Medicine

## 2022-12-11 NOTE — Telephone Encounter (Signed)
Patient calling about lab results. 

## 2022-12-11 NOTE — Telephone Encounter (Signed)
    Patient calling for lab results 

## 2022-12-14 NOTE — Telephone Encounter (Signed)
Spoke with patient.

## 2022-12-14 NOTE — Telephone Encounter (Signed)
No VM set up.

## 2022-12-16 ENCOUNTER — Telehealth: Payer: Self-pay | Admitting: Internal Medicine

## 2022-12-16 DIAGNOSIS — K219 Gastro-esophageal reflux disease without esophagitis: Secondary | ICD-10-CM

## 2022-12-16 LAB — CALPROTECTIN, FECAL: Calprotectin, Fecal: 40 ug/g (ref 0–120)

## 2022-12-16 MED ORDER — PANTOPRAZOLE SODIUM 40 MG PO TBEC: 40.0000 mg | DELAYED_RELEASE_TABLET | Freq: Every day | ORAL | 0 refills | Status: DC

## 2022-12-16 NOTE — Telephone Encounter (Signed)
No signs of inflammatory bowel disease on workup. Patients acute lower quadrant abdominal pain has resolved. Continues to have periodic epigastric pain. He has history of GERD and Endoscopy with biopsy proven gastritis negative for H.Pylori. Will start PPI. Follow up in 6 weeks. Patient will call to schedule follow up.

## 2023-04-27 ENCOUNTER — Encounter: Payer: BC Managed Care – PPO | Admitting: Internal Medicine

## 2023-06-20 NOTE — Patient Instructions (Signed)
        Great to see you today.  I have refilled the medication(s) we provide.    - Please take medications as prescribed. - Follow up with your primary health provider if any health concerns arises. - If symptoms worsen please contact your primary care provider and/or visit the emergency department.  

## 2023-06-20 NOTE — Progress Notes (Unsigned)
   Established Patient Office Visit   Subjective  [name], male    DOB: 08-15-1991  Age: 32 y.o. MRN: [id number]  No chief complaint on file.   He  has a past medical history of Anxiety, Cholesteatoma of ear, left (2023), COVID-19 (04/05/2020), and RUQ pain (08/03/2019).  HPI  ROS    Objective:     There were no vitals taken for this visit. {Vitals History (Optional):23777}  Physical Exam   No results found for any visits on 06/21/23.  The ASCVD Risk score (Arnett DK, et al., 2019) failed to calculate for the following reasons:   The 2019 ASCVD risk score is only valid for ages 22 to 9    Assessment & Plan:  There are no diagnoses linked to this encounter.  No follow-ups on file.   Cruzita Lederer Newman Nip, FNP

## 2023-06-21 ENCOUNTER — Ambulatory Visit: Payer: BC Managed Care – PPO | Admitting: Family Medicine

## 2023-06-21 ENCOUNTER — Encounter: Payer: Self-pay | Admitting: Family Medicine

## 2023-06-21 ENCOUNTER — Ambulatory Visit (HOSPITAL_COMMUNITY)
Admission: RE | Admit: 2023-06-21 | Discharge: 2023-06-21 | Disposition: A | Discharge status: Home / Self Care | Payer: BC Managed Care – PPO | Source: Ambulatory Visit | Attending: Family Medicine | Admitting: Family Medicine

## 2023-06-21 VITALS — BP 127/87 | HR 90 | Ht 73.0 in | Wt 188.0 lb

## 2023-06-21 DIAGNOSIS — M25562 Pain in left knee: Secondary | ICD-10-CM | POA: Insufficient documentation

## 2023-06-21 MED ORDER — CYCLOBENZAPRINE HCL 5 MG PO TABS: 5.0000 mg | ORAL_TABLET | Freq: Three times a day (TID) | ORAL | 1 refills | Status: DC | PRN

## 2023-06-21 MED ORDER — KETOROLAC TROMETHAMINE 60 MG/2ML IM SOLN
60.0000 mg | Freq: Once | INTRAMUSCULAR | Status: AC
  Administered 2023-06-21: 60 mg via INTRAMUSCULAR

## 2023-06-21 MED ORDER — PREDNISONE 20 MG PO TABS: 20.0000 mg | ORAL_TABLET | Freq: Two times a day (BID) | ORAL | 0 refills | Status: AC

## 2023-06-21 NOTE — Assessment & Plan Note (Signed)
Hx of left knee surgery 2017 Xray ordered awaiting results will follow up Prednisone 20 mg twice daily x 5 days Flexeril 5 mg PRN I explained to the patient that non-pharmacological interventions include the application of ice or heat, rest, and recommended range of motion exercises along with gentle stretching. For pain management, Tylenol was advised. The patient was instructed to follow up if symptoms worsen or persist. The patient verbalized understanding of the care plan, and all questions were answered.

## 2023-06-29 ENCOUNTER — Telehealth: Payer: Self-pay | Admitting: Internal Medicine

## 2023-06-29 NOTE — Telephone Encounter (Signed)
Patient advised.

## 2023-06-29 NOTE — Telephone Encounter (Signed)
Copied from CRM [id number]. Topic: Clinical - Prescription Issue >> Jun 29, 2023 10:11 AM Georgeanna Harrison H wrote: Reason for CRM: Pt is having issues with Prednisone, bad stomach ache, nausea, insomnia.

## 2023-07-19 ENCOUNTER — Other Ambulatory Visit: Payer: Self-pay | Admitting: Family Medicine

## 2023-07-19 ENCOUNTER — Encounter: Payer: Self-pay | Admitting: Family Medicine

## 2023-07-19 ENCOUNTER — Ambulatory Visit: Payer: Self-pay | Admitting: Internal Medicine

## 2023-07-19 ENCOUNTER — Telehealth: Payer: Self-pay | Admitting: Internal Medicine

## 2023-07-19 ENCOUNTER — Ambulatory Visit: Payer: BC Managed Care – PPO | Admitting: Family Medicine

## 2023-07-19 VITALS — BP 138/88 | HR 98 | Ht 73.0 in | Wt 190.1 lb

## 2023-07-19 DIAGNOSIS — M25562 Pain in left knee: Secondary | ICD-10-CM

## 2023-07-19 DIAGNOSIS — K219 Gastro-esophageal reflux disease without esophagitis: Secondary | ICD-10-CM | POA: Diagnosis not present

## 2023-07-19 DIAGNOSIS — Z23 Encounter for immunization: Secondary | ICD-10-CM

## 2023-07-19 MED ORDER — PANTOPRAZOLE SODIUM 40 MG PO TBEC: 40.0000 mg | DELAYED_RELEASE_TABLET | Freq: Every day | ORAL | 2 refills | Status: DC

## 2023-07-19 NOTE — Progress Notes (Signed)
Established Patient Office Visit   Subjective  [name], male    DOB: 11-Jul-1991  Age: 32 y.o. MRN: [id number]  Chief Complaint  Patient presents with   Acute Visit    Left knee pain. Bottom of left inside knee.     He  has a past medical history of Anxiety, Cholesteatoma of ear, left (2023), COVID-19 (04/05/2020), and RUQ pain (08/03/2019).  HPI Patient presents to the clinic for continued left knee pain. For the details of today's visit, please refer to assessment and plan.   Review of Systems  Constitutional:  Negative for chills and fever.  Respiratory:  Negative for shortness of breath.   Cardiovascular:  Negative for chest pain.  Musculoskeletal:  Positive for joint pain and myalgias.      Objective:     BP 138/88   Pulse 98   Ht 6\' 1"  (1.854 m)   Wt 190 lb 1.9 oz (86.2 kg)   SpO2 97%   BMI 25.08 kg/m  BP Readings from Last 3 Encounters:  07/19/23 138/88  06/21/23 127/87  11/23/22 129/79      Physical Exam Vitals reviewed.  Constitutional:      General: He is not in acute distress.    Appearance: Normal appearance. He is not ill-appearing, toxic-appearing or diaphoretic.  HENT:     Head: Normocephalic.  Eyes:     General:        Right eye: No discharge.        Left eye: No discharge.     Conjunctiva/sclera: Conjunctivae normal.  Cardiovascular:     Rate and Rhythm: Normal rate.     Pulses: Normal pulses.     Heart sounds: Normal heart sounds.  Pulmonary:     Effort: Pulmonary effort is normal. No respiratory distress.     Breath sounds: Normal breath sounds.  Musculoskeletal:        General: No swelling or tenderness. Normal range of motion.     Right knee: No swelling or erythema. Normal range of motion.     Left knee: No swelling or erythema. Normal range of motion.     Right lower leg: No edema.     Left lower leg: No edema.  Skin:    General: Skin is warm and dry.     Capillary Refill: Capillary refill takes less than  2 seconds.  Neurological:     Mental Status: He is alert.     Coordination: Coordination normal.     Gait: Gait normal.      No results found for any visits on 07/19/23.  The ASCVD Risk score (Arnett DK, et al., 2019) failed to calculate for the following reasons:   The 2019 ASCVD risk score is only valid for ages 70 to 60    Assessment & Plan:  Left knee pain, unspecified chronicity Assessment & Plan: Recent left knee Xray results normal Advise to follow up with orthopedics for further evaluation  Discussed  using a knee brace to provide support and reduce strain on the joint during daily activities. Rest the knee, apply ice to reduce swelling, and elevate it when possible. Gentle stretching and strengthening exercises can also help improve mobility and alleviate discomfort.   Chronic GERD -     Pantoprazole Sodium; Take 1 tablet (40 mg total) by mouth daily.  Dispense: 60 tablet; Refill: 2  Encounter for immunization -     Flu vaccine trivalent PF, 6mos and older(Flulaval,Afluria,Fluarix,Fluzone)  Return if symptoms worsen or fail to improve.   Cruzita Lederer Newman Nip, FNP

## 2023-07-19 NOTE — Telephone Encounter (Signed)
Hey referral was placed his  his xray results were normal,   He can walk in at [facility] [address], [place], [place] [address]

## 2023-07-19 NOTE — Telephone Encounter (Signed)
Copied from CRM [id number]. Topic: Clinical - Red Word Triage >> Jul 19, 2023 10:50 AM Fuller Mandril wrote: Red Word that prompted transfer to Nurse Triage: excruciating / worsening pain knee   Chief Complaint: left knee pain right below the knee cap Symptoms: lump at back of knee where he had a previous surgery Frequency: ongoing for 2 or 3 days Pertinent Negatives: Patient denies fever, redness, swelling, shortness of breath Disposition: [] ED /[] Urgent Care (no appt availability in office) / [x] Appointment(In office/virtual)/ []  [facility]/ [] Home Care/ [] Refused Recommended Disposition /[] [facility]/ []  Follow-up with PCP Additional Notes: The patient reported 8-9/10 left knee pain that has been ongoing for the past 2-3 days.  He reported that he previously received a shot and prednisone for the pain but he had to stop taking the prednisone due to a reaction.  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  He was not sure if the plan was to refer him to ortho but he reported that the pain is really bad. He has been putting heat and ice on the knee and taking over the counter pain medicine that only helps a short time. Scheduled for same day appointment for further evaluation.    Reason for Disposition  [1] MODERATE pain (e.g., interferes with normal activities, limping) AND [2] present > 3 days  Answer Assessment - Initial Assessment Questions 1. LOCATION and RADIATION: "Where is the pain located?"       constant throbbing; right below knee cap on inner leg feels like it's going to snap; shoots down to shin area worsens over the past couple of days  2. QUALITY: "What does the pain feel like?"  (e.g., sharp, dull, aching, burning)     Throbbing  3. SEVERITY: "How bad is the pain?" "What does it keep you from doing?"   (Scale 1-10; or mild, moderate, severe)   -  MILD (1-3): doesn't interfere with normal activities     -  MODERATE (4-7): interferes with normal activities (e.g., work or school) or awakens from sleep, limping    -  SEVERE (8-10): excruciating pain, unable to do any normal activities, unable to walk     8-9/10 able to walk.  When I plant my left foot and put pressure it feels like something is stabbing in my knee 4. ONSET: "When did the pain start?" "Does it come and go, or is it there all the time?"     Past 2 or 3 days 5. RECURRENT: "Have you had this pain before?" If Yes, ask: "When, and what happened then?"     Yes; previously received a shot in knee, unsure if being referred to ortho; worsening with cold weather  6. SETTING: "Has there been any recent work, exercise or other activity that involved that part of the body?"      Nothing out of the ordinary  7. AGGRAVATING FACTORS: "What makes the knee pain worse?" (e.g., walking, climbing stairs, running)     Work - climbing in and out of truck [employer] 8. ASSOCIATED SYMPTOMS: "Is there any swelling or redness of the knee?"     None; back of knee where there was a cyst removed, a lump is coming out of the back; the main problem is right below the knee cap  Protocols used: Knee Pain-A-AH

## 2023-07-19 NOTE — Telephone Encounter (Signed)
Copied from CRM [id number]. Topic: Referral - Status >> Jul 19, 2023 10:52 AM Fuller Mandril wrote:   Reason for CRM: Pt pain is getting worse. He wanted to know if he was still going to be referred to orthopedic

## 2023-07-19 NOTE — Assessment & Plan Note (Addendum)
Recent left knee Xray results normal Advise to follow up with orthopedics for further evaluation  Discussed  using a knee brace to provide support and reduce strain on the joint during daily activities. Rest the knee, apply ice to reduce swelling, and elevate it when possible. Gentle stretching and strengthening exercises can also help improve mobility and alleviate discomfort.

## 2023-07-19 NOTE — Patient Instructions (Addendum)
        Great to see you today.  I have refilled the medication(s) we provide.    [facility]  [facility] [address] [place], [place] [address] [phone]   - Please take medications as prescribed. - Follow up with your primary health provider if any health concerns arises. - If symptoms worsen please contact your primary care provider and/or visit the emergency department.

## 2023-08-01 DIAGNOSIS — R61 Generalized hyperhidrosis: Secondary | ICD-10-CM | POA: Diagnosis not present

## 2023-08-01 DIAGNOSIS — R42 Dizziness and giddiness: Secondary | ICD-10-CM | POA: Diagnosis not present

## 2023-08-01 DIAGNOSIS — Z87891 Personal history of nicotine dependence: Secondary | ICD-10-CM | POA: Diagnosis not present

## 2023-08-01 DIAGNOSIS — R110 Nausea: Secondary | ICD-10-CM | POA: Diagnosis not present

## 2023-08-01 DIAGNOSIS — R6883 Chills (without fever): Secondary | ICD-10-CM | POA: Diagnosis not present

## 2023-08-04 ENCOUNTER — Ambulatory Visit (INDEPENDENT_AMBULATORY_CARE_PROVIDER_SITE_OTHER): Payer: BC Managed Care – PPO | Admitting: Orthopaedic Surgery

## 2023-08-04 ENCOUNTER — Encounter: Payer: Self-pay | Admitting: Orthopaedic Surgery

## 2023-08-04 VITALS — Ht 72.0 in | Wt 190.0 lb

## 2023-08-04 DIAGNOSIS — M25562 Pain in left knee: Secondary | ICD-10-CM | POA: Diagnosis not present

## 2023-08-04 DIAGNOSIS — G8929 Other chronic pain: Secondary | ICD-10-CM | POA: Diagnosis not present

## 2023-08-04 MED ORDER — LIDOCAINE HCL 1 % IJ SOLN
0.5000 mL | INTRAMUSCULAR | Status: AC | PRN
  Administered 2023-08-04: .5 mL

## 2023-08-04 MED ORDER — METHYLPREDNISOLONE ACETATE 40 MG/ML IJ SUSP
40.0000 mg | INTRAMUSCULAR | Status: AC | PRN
  Administered 2023-08-04: 40 mg via INTRA_ARTICULAR

## 2023-08-04 MED ORDER — BUPIVACAINE HCL 0.5 % IJ SOLN
3.0000 mL | INTRAMUSCULAR | Status: AC | PRN
  Administered 2023-08-04: 3 mL via INTRA_ARTICULAR

## 2023-08-04 NOTE — Progress Notes (Signed)
Office Visit Note   Patient: Matthew Andrews           Date of Birth: 09/25/1990           MRN: [id number] Visit Date: 08/04/2023              Requested by: Rica Records, FNP [phone] [address] [place],  [place] [address] PCP: Billie Lade, MD   Assessment & Plan: Visit Diagnoses:  1. Chronic pain of left knee     Plan: Left knee injection performed he has some recurrence of Baker's cyst likely had previous medial meniscectomy posteriorly.  Hopefully get good relief with injection I will recheck him in 1 month.  Continue ice Tylenol/ibuprofen as needed.  Follow-Up Instructions: No follow-ups on file.   Orders:  No orders of the defined types were placed in this encounter.  No orders of the defined types were placed in this encounter.     Procedures: Large Joint Inj: L knee on 08/04/2023 9:53 AM Indications: joint swelling and pain Details: 22 G 1.5 in needle, anterolateral approach  Arthrogram: No  Medications: 0.5 mL lidocaine 1 %; 3 mL bupivacaine 0.5 %; 40 mg methylPREDNISolone acetate 40 MG/ML Outcome: tolerated well, no immediate complications Procedure, treatment alternatives, risks and benefits explained, specific risks discussed. Consent was given by the patient. Immediately prior to procedure a time out was called to verify the correct patient, procedure, equipment, support staff and site/side marked as required. Patient was prepped and draped in the usual sterile fashion.       Clinical Data: No additional findings.   Subjective: Chief Complaint  Patient presents with   Left Knee - Pain    HPI 32 year old male with history of knee arthroscopy by Dr. Terrilee Croak for probable meniscal tear in the past.  He had a large Baker's cyst later had Baker's cyst excised with a posterior incision by Dr. Terrilee Croak.  He has not increased pain in his knee some recurrence of the Baker's cyst.  Aching in his knee when it is cold.  More medial than lateral  joint line discomfort.  He has used ibuprofen Tylenol and ice and has noted some swelling in his knee.  He is continuing to work and works for [employer]  Review of Systems all systems noncontributory to HPI.   Objective: Vital Signs: Ht 6' (1.829 m)   Wt 190 lb (86.2 kg)   BMI 25.77 kg/m   Physical Exam Constitutional:      Appearance: He is well-developed.  HENT:     Head: Normocephalic and atraumatic.     Right Ear: External ear normal.     Left Ear: External ear normal.  Eyes:     Pupils: Pupils are equal, round, and reactive to light.  Neck:     Thyroid: No thyromegaly.     Trachea: No tracheal deviation.  Cardiovascular:     Rate and Rhythm: Normal rate.  Pulmonary:     Effort: Pulmonary effort is normal.     Breath sounds: No wheezing.  Abdominal:     General: Bowel sounds are normal.     Palpations: Abdomen is soft.  Musculoskeletal:     Cervical back: Neck supple.  Skin:    General: Skin is warm and dry.     Capillary Refill: Capillary refill takes less than 2 seconds.  Neurological:     Mental Status: He is alert and oriented to person, place, and time.  Psychiatric:  Behavior: Behavior normal.        Thought Content: Thought content normal.        Judgment: Judgment normal.     Ortho Exam moderate Baker's cyst left knee none on the right.  Full extension ambulatory with a trace left knee limp medial joint line tenderness with palpation no varus or valgus malalignment.  Negative logroll of the hips.  Specialty Comments:  No specialty comments available.  Imaging: arrative & Impression  CLINICAL DATA:  Acute left knee pain.   EXAM: LEFT KNEE - 1-2 VIEW   COMPARISON:  None Available.   FINDINGS: No evidence of fracture, dislocation, or joint effusion. The alignment and joint spaces are normal. No evidence of arthropathy or other focal bone abnormality. Soft tissues are unremarkable.   IMPRESSION: Negative radiographs of the left knee.      Electronically Signed   By: Narda Rutherford M.D.   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   Abdominal pain, epigastric 12/01/2021   Otalgia of left ear 09/28/2021   Elevated blood-pressure reading without diagnosis of hypertension 09/28/2021   Need for lipid screening 09/28/2021   Acute right ankle pain 09/25/2021   Cholesteatoma of ear, left 2023   Pyelonephritis 07/04/2021   Chest pain 10/25/2020   Family history of skin cancer 08/02/2020   Lesion of skin of nose 08/02/2020   Sciatica of left side associated with disorder of lumbar spine 04/16/2020   RUQ pain 08/03/2019   Chronic GERD 06/08/2019   Cervical spondylosis without myelopathy 04/26/2017   Spondylolysis, lumbar region 04/26/2017   Past Medical History:  Diagnosis Date   Anxiety    Cholesteatoma of ear, left 2023   COVID-19 04/05/2020   RUQ pain 08/03/2019   Added automatically from request for surgery [id number]    Family History  Problem Relation Age of Onset   Skin cancer Mother    Melanoma Father     Past Surgical History:  Procedure Laterality Date   BIOPSY  08/31/2019   Procedure: BIOPSY;  Surgeon: Malissa Hippo, MD;  [facility];  Service: Endoscopy;;  duodenum, gastric   ESOPHAGOGASTRODUODENOSCOPY N/A 08/31/2019   Procedure: ESOPHAGOGASTRODUODENOSCOPY (EGD);  Surgeon: Malissa Hippo, MD;  Location[facility];  Service: Endoscopy;  Laterality: N/A;  2:50   knee surgery Left    LUMBAR SPINE SURGERY     2014   MIDDLE EAR SURGERY Left 2023    Social History   Occupational History   Occupation: Chief Financial Officer    Comment: tree trimmer   Tobacco Use   Smoking status: Never    Passive exposure: Never   Smokeless tobacco: Never  Vaping Use   Vaping status: Never Used  Substance and Sexual Activity   Alcohol use: Yes    Comment: occasional   Drug use: Never   Sexual activity: Yes

## 2023-08-05 ENCOUNTER — Telehealth: Payer: Self-pay | Admitting: Radiology

## 2023-08-05 ENCOUNTER — Encounter: Payer: BC Managed Care – PPO | Admitting: Internal Medicine

## 2023-08-05 NOTE — Telephone Encounter (Signed)
Patient called stating he had to pull over when he left office yesterday post knee injection due to heart rate speeding up and flushing. He had insomnia last night and has had flushing of his hands and face occassionally throughout the day. Per Dr. Ophelia Charter, it appears he is sensitive to the steroid and it will take a couple of days to work it's way out of his system. Patient states that he has taken oral prednisone multiple times, however, the last time he took it, it gave him some of these symptoms. Advised patient he should relay this information to his PCP so that they are aware. Patient to call me if he has continued or worsening problems.

## 2023-08-09 DIAGNOSIS — R0981 Nasal congestion: Secondary | ICD-10-CM | POA: Diagnosis not present

## 2023-08-09 DIAGNOSIS — R030 Elevated blood-pressure reading, without diagnosis of hypertension: Secondary | ICD-10-CM | POA: Diagnosis not present

## 2023-08-09 DIAGNOSIS — B349 Viral infection, unspecified: Secondary | ICD-10-CM | POA: Diagnosis not present

## 2023-08-09 DIAGNOSIS — R051 Acute cough: Secondary | ICD-10-CM | POA: Diagnosis not present

## 2023-08-13 ENCOUNTER — Telehealth: Payer: Self-pay

## 2023-08-13 ENCOUNTER — Telehealth: Payer: BC Managed Care – PPO | Admitting: Family Medicine

## 2023-08-13 DIAGNOSIS — M79605 Pain in left leg: Secondary | ICD-10-CM

## 2023-08-13 DIAGNOSIS — J020 Streptococcal pharyngitis: Secondary | ICD-10-CM | POA: Diagnosis not present

## 2023-08-13 MED ORDER — AMOXICILLIN 875 MG PO TABS: 875.0000 mg | ORAL_TABLET | Freq: Two times a day (BID) | ORAL | 0 refills | Status: AC

## 2023-08-13 NOTE — Telephone Encounter (Signed)
Left knee pain s/p injection 08/04/2023 states that he developed a reaction to the injection and this has since improved however he says the knee pain is really significant and he is having difficulty walking. No reports of swelling but does state that the knee is "tight" and difficulty with ROM asking if there is anything that he can do to help with discomfort. I offered an appt and he declined and said that Dr. Ophelia Charter said to call if he is in pain and that he can discussion options by phone. He is at work and unable to come in today.

## 2023-08-13 NOTE — Telephone Encounter (Signed)
Order entered. Spoke with Carlon in the [place] office and she will obtain authorization and get patient scheduled at [facility] in [place].

## 2023-08-13 NOTE — Addendum Note (Signed)
Addended by: Rogers Seeds on: 08/13/2023 10:39 AM   Modules accepted: Orders

## 2023-08-13 NOTE — Patient Instructions (Signed)

## 2023-08-13 NOTE — Progress Notes (Signed)
Virtual Visit Consent   Matthew Andrews, you are scheduled for a virtual visit with a [facility] provider today. Just as with appointments in the office, your consent must be obtained to participate. Your consent will be active for this visit and any virtual visit you may have with one of our providers in the next 365 days. If you have a MyChart account, a copy of this consent can be sent to you electronically.  As this is a virtual visit, video technology does not allow for your provider to perform a traditional examination. This may limit your provider's ability to fully assess your condition. If your provider identifies any concerns that need to be evaluated in person or the need to arrange testing (such as labs, EKG, etc.), we will make arrangements to do so. Although advances in technology are sophisticated, we cannot ensure that it will always work on either your end or our end. If the connection with a video visit is poor, the visit may have to be switched to a telephone visit. With either a video or telephone visit, we are not always able to ensure that we have a secure connection.  By engaging in this virtual visit, you consent to the provision of healthcare and authorize for your insurance to be billed (if applicable) for the services provided during this visit. Depending on your insurance coverage, you may receive a charge related to this service.  I need to obtain your verbal consent now. Are you willing to proceed with your visit today? SYLVIO KONE has provided verbal consent on 08/13/2023 for a virtual visit (video or telephone). Georgana Curio, FNP  Date: 08/13/2023 3:12 PM  Virtual Visit via Video Note   I, Georgana Curio, connected with  Matthew Andrews  ([id number], 1990-12-22) on 08/13/23 at  3:00 PM EST by a video-enabled telemedicine application and verified that I am speaking with the correct person using two identifiers.  Location: PatientHome Provider: Virtual Visit Location  Provider: Home Office   I discussed the limitations of evaluation and management by telemedicine and the availability of in person appointments. The patient expressed understanding and agreed to proceed.    History of Present Illness: Matthew Andrews is a 32 y.o. who identifies as a male who was assigned male at birth, and is being seen today for fullness in left ear, sore throat, sx for 6 days worsening. Started like the flu. Neg covid and flu at [facility]. He says they swabbed his tongue for strep test and he doesn't feel it was accurate. He has more ear pain and had exposure to strep from son. 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   Abdominal pain, epigastric 12/01/2021   Otalgia of left ear 09/28/2021   Elevated blood-pressure reading without diagnosis of hypertension 09/28/2021   Need for lipid screening 09/28/2021   Acute right ankle pain 09/25/2021   Cholesteatoma of ear, left 2023   Pyelonephritis 07/04/2021   Chest pain 10/25/2020   Family history of skin cancer 08/02/2020   Lesion of skin of nose 08/02/2020  Sciatica of left side associated with disorder of lumbar spine 04/16/2020   RUQ pain 08/03/2019   Chronic GERD 06/08/2019   Cervical spondylosis without myelopathy 04/26/2017   Spondylolysis, lumbar region 04/26/2017    Allergies: No Known Allergies Medications:  Current Outpatient Medications:    amoxicillin (AMOXIL) 875 MG tablet, Take 1 tablet (875 mg total) by mouth 2 (two) times daily for 10 days., Disp: 20 tablet, Rfl: 0   albuterol (VENTOLIN HFA)  108 (90 Base) MCG/ACT inhaler, Inhale 2 puffs into the lungs every 6 (six) hours as needed for wheezing or shortness of breath., Disp: 18 g, Rfl: 0   cetirizine (ZYRTEC) 10 MG tablet, Take 1 tablet (10 mg total) by mouth daily., Disp: 30 tablet, Rfl: 11   cyclobenzaprine (FLEXERIL) 5 MG tablet, Take 1 tablet (5 mg total) by mouth 3 (three) times daily as needed. (Patient not taking: Reported on 07/19/2023), Disp: 30 tablet, Rfl: 1   fluticasone (FLONASE) 50 MCG/ACT nasal spray, Place 2 sprays into both nostrils daily., Disp: 16 g, Rfl: 6   pantoprazole (PROTONIX) 40 MG tablet, Take 1 tablet (40 mg total) by mouth daily., Disp: 60 tablet, Rfl: 2  Observations/Objective: Patient is well-developed, well-nourished in no acute distress.  Resting comfortably  at home.  Head is normocephalic, atraumatic.  No labored breathing.  Speech is clear and coherent with logical content.  Patient is alert and oriented at baseline.    Assessment and Plan: 1. Strep pharyngitis (Primary)  Increase fluids, warm salt water gargles, ibuprofen as directed, UC as needed.   Follow Up Instructions: I discussed the assessment and treatment plan with the patient. The patient was provided an opportunity to ask questions and all were answered. The patient agreed with the plan and demonstrated an understanding of the instructions.  A copy of instructions were sent to the patient via MyChart unless otherwise noted below.     The patient was advised to call back or seek an in-person evaluation if the symptoms worsen or if the condition fails to improve as anticipated.    Georgana Curio, FNP

## 2023-08-26 ENCOUNTER — Telehealth: Payer: Self-pay | Admitting: Radiology

## 2023-08-26 NOTE — Telephone Encounter (Signed)
 Received fax from [facility] that patient asked to cancel appointment for doppler. Dr. Ophelia Charter is aware.

## 2023-09-08 ENCOUNTER — Ambulatory Visit: Payer: BC Managed Care – PPO | Admitting: Orthopaedic Surgery

## 2023-09-16 ENCOUNTER — Encounter: Payer: Self-pay | Admitting: Orthopaedic Surgery

## 2023-09-16 ENCOUNTER — Ambulatory Visit (INDEPENDENT_AMBULATORY_CARE_PROVIDER_SITE_OTHER): Payer: BC Managed Care – PPO | Admitting: Orthopaedic Surgery

## 2023-09-16 VITALS — Ht 72.0 in | Wt 180.0 lb

## 2023-09-16 DIAGNOSIS — G8929 Other chronic pain: Secondary | ICD-10-CM | POA: Diagnosis not present

## 2023-09-16 DIAGNOSIS — M25562 Pain in left knee: Secondary | ICD-10-CM | POA: Diagnosis not present

## 2023-09-16 NOTE — Progress Notes (Signed)
Office Visit Note   Patient: Matthew Andrews           Date of Birth: 10/29/1990           MRN: [id number] Visit Date: 09/16/2023              Requested by: Billie Lade, MD [address] [place],  [place] [address] PCP: Billie Lade, MD   Assessment & Plan: Visit Diagnoses:  1. Chronic pain of left knee     Plan: Will proceed with MRI scan for persistent problems with left knee pain limping with previous history of partial meniscectomy and Baker's cyst excision.  He has taken Tylenol, ibuprofen intermittent ice intra-articular injection.  Office follow-up after MRI scan left knee rule out recurrent medial meniscal tear left knee.  Follow-Up Instructions: No follow-ups on file.   Orders:  Orders Placed This Encounter  Procedures   MR Knee Left w/o contrast   No orders of the defined types were placed in this encounter.     Procedures: No procedures performed   Clinical Data: No additional findings.   Subjective: Chief Complaint  Patient presents with   Left Knee - Pain, Follow-up    HPI 33 year old male returns with ongoing problems with left knee pain.  Originally had knee arthroscopy by Dr. Terrilee Croak many years ago and then later excision of posterior Baker's cyst.  He has had some discomfort in his knee injection 08/04/2023 helped short-term and now has had gradual progression of his symptoms.  7 difficulty walking.  Does not notice particular amount of swelling no true catching just pain with ambulation.  Also his pain is posterior.  He has used Tylenol, rest, ice.  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 Previous x-rays 06/22/2023 previously negative for degenerative changes or acute bone changes. Review of Systems all systems updated unchanged from 08/04/2023 office visit.   Objective: Vital Signs: Ht 6' (1.829 m)   Wt 180 lb (81.6 kg)   BMI 24.41 kg/m   Physical Exam Constitutional:      Appearance: He is  well-developed.  HENT:     Head: Normocephalic and atraumatic.     Right Ear: External ear normal.     Left Ear: External ear normal.  Eyes:     Pupils: Pupils are equal, round, and reactive to light.  Neck:     Thyroid: No thyromegaly.     Trachea: No tracheal deviation.  Cardiovascular:     Rate and Rhythm: Normal rate.  Pulmonary:     Effort: Pulmonary effort is normal.     Breath sounds: No wheezing.  Abdominal:     General: Bowel sounds are normal.     Palpations: Abdomen is soft.  Musculoskeletal:     Cervical back: Neck supple.  Skin:    General: Skin is warm and dry.     Capillary Refill: Capillary refill takes less than 2 seconds.  Neurological:     Mental Status: He is alert and oriented to person, place, and time.  Psychiatric:        Behavior: Behavior normal.        Thought Content: Thought content normal.        Judgment: Judgment normal.     Ortho Exam patient is left knee medial joint line tenderness exactly at the [facility] and posterior.  No definite palpable Baker's cyst hamstrings are normal.  Pes bursa is normal ACL  PCL exam is normal negative logroll hips knees reach full extension no sciatic notch tenderness.  He is amatory with a left knee limp.  Specialty Comments:  No specialty comments available.  Imaging: No results found.   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   Abdominal pain, epigastric 12/01/2021   Otalgia of left ear 09/28/2021   Elevated blood-pressure reading without diagnosis of hypertension 09/28/2021   Need for lipid  screening 09/28/2021   Acute right ankle pain 09/25/2021   Cholesteatoma of ear, left 2023   Pyelonephritis 07/04/2021   Chest pain 10/25/2020   Family history of skin cancer 08/02/2020   Lesion of skin of nose 08/02/2020   Sciatica of left side associated with disorder of lumbar spine 04/16/2020   RUQ pain 08/03/2019   Chronic GERD 06/08/2019   Cervical spondylosis without myelopathy 04/26/2017   Spondylolysis, lumbar region 04/26/2017   Past Medical History:  Diagnosis Date   Anxiety    Cholesteatoma of ear, left 2023   COVID-19 04/05/2020   RUQ pain 08/03/2019   Added automatically from request for surgery [id number]    Family History  Problem Relation Age of Onset   Skin cancer Mother    Melanoma Father     Past Surgical History:  Procedure Laterality Date   BIOPSY  08/31/2019   Procedure: BIOPSY;  Surgeon: Malissa Hippo, MD;  [facility];  Service: Endoscopy;;  duodenum, gastric   ESOPHAGOGASTRODUODENOSCOPY N/A 08/31/2019   Procedure: ESOPHAGOGASTRODUODENOSCOPY (EGD);  Surgeon: Malissa Hippo, MD;  [facility];  Service: Endoscopy;  Laterality: N/A;  2:50   knee surgery Left    LUMBAR SPINE SURGERY     2014   MIDDLE EAR SURGERY Left 2023   Social History   Occupational History   Occupation: Chief Financial Officer    Comment: tree trimmer   Tobacco Use   Smoking status: Never    Passive exposure: Never   Smokeless tobacco: Never  Vaping Use   Vaping status: Never Used  Substance and Sexual Activity   Alcohol use: Yes    Comment: occasional   Drug use: Never   Sexual activity: Yes

## 2023-09-27 ENCOUNTER — Encounter: Payer: BC Managed Care – PPO | Admitting: Internal Medicine

## 2023-10-05 ENCOUNTER — Encounter: Payer: Self-pay | Admitting: Internal Medicine

## 2023-10-06 ENCOUNTER — Other Ambulatory Visit: Payer: Self-pay | Admitting: Family Medicine

## 2023-10-06 DIAGNOSIS — K219 Gastro-esophageal reflux disease without esophagitis: Secondary | ICD-10-CM

## 2023-10-15 DIAGNOSIS — M948X6 Other specified disorders of cartilage, lower leg: Secondary | ICD-10-CM | POA: Diagnosis not present

## 2023-10-15 DIAGNOSIS — M25562 Pain in left knee: Secondary | ICD-10-CM | POA: Diagnosis not present

## 2023-10-15 DIAGNOSIS — G8929 Other chronic pain: Secondary | ICD-10-CM | POA: Diagnosis not present

## 2023-10-15 DIAGNOSIS — M7122 Synovial cyst of popliteal space [Baker], left knee: Secondary | ICD-10-CM | POA: Diagnosis not present

## 2023-10-15 DIAGNOSIS — M25462 Effusion, left knee: Secondary | ICD-10-CM | POA: Diagnosis not present

## 2023-10-21 ENCOUNTER — Encounter: Payer: Self-pay | Admitting: Orthopaedic Surgery

## 2023-10-21 ENCOUNTER — Ambulatory Visit: Admitting: Orthopaedic Surgery

## 2023-10-21 VITALS — Ht 72.0 in | Wt 180.0 lb

## 2023-10-21 DIAGNOSIS — M25562 Pain in left knee: Secondary | ICD-10-CM

## 2023-10-21 NOTE — Progress Notes (Signed)
 Office Visit Note   Patient: Matthew Andrews           Date of Birth: [birth year]           MRN: [id number] Visit Date: 10/21/2023              Requested by: Billie Lade, MD [address] [place],  [place] [address] PCP: Billie Lade, MD   Assessment & Plan: Visit Diagnoses:  1. Left knee pain, unspecified chronicity     Plan: MRI scan is reviewed small Baker's cyst present.  Regularity lateral compartment without new meniscal tear.  Medial compartment patellofemoral carpal compartment is normal.  ACL PCL normal as well as collateral ligaments.  He can use some intermittent anti-inflammatories or ice after work activities if he has some discomfort no indications for further knee arthroscopy.  Follow-up as needed.  We reviewed the MRI images.  I gave him a copy of the report.  Follow-Up Instructions: No follow-ups on file.   Orders:  No orders of the defined types were placed in this encounter.  No orders of the defined types were placed in this encounter.     Procedures: No procedures performed   Clinical Data: No additional findings.   Subjective: Chief Complaint  Patient presents with   Left Knee - Pain, Follow-up    MRI review    HPI 33 year old male returns with ongoing aching pain in his left knee without catching or locking.  Past history of partial meniscectomy years ago by Dr. Terrilee Croak and also Baker's cyst removal.  No giving way.  MRI scan has been obtained and is available for review.  Review of Systems all systems updated unchanged from 08/04/2023 office visit.   Objective: Vital Signs: Ht 6' (1.829 m)   Wt 180 lb (81.6 kg)   BMI 24.41 kg/m   Physical Exam Constitutional:      Appearance: He is well-developed.  HENT:     Head: Normocephalic and atraumatic.     Right Ear: External ear normal.     Left Ear: External ear normal.  Eyes:     Pupils: Pupils are equal, round, and reactive to light.  Neck:     Thyroid: No thyromegaly.      Trachea: No tracheal deviation.  Cardiovascular:     Rate and Rhythm: Normal rate.  Pulmonary:     Effort: Pulmonary effort is normal.     Breath sounds: No wheezing.  Abdominal:     General: Bowel sounds are normal.     Palpations: Abdomen is soft.  Musculoskeletal:     Cervical back: Neck supple.  Skin:    General: Skin is warm and dry.     Capillary Refill: Capillary refill takes less than 2 seconds.  Neurological:     Mental Status: He is alert and oriented to person, place, and time.  Psychiatric:        Behavior: Behavior normal.        Thought Content: Thought content normal.        Judgment: Judgment normal.     Ortho Exam full flexion extension mild tenderness lateral joint line.  No pain with hyperextension.  No rash or exposed skin.  Specialty Comments:  No specialty comments available.  Imaging: Narrative  CLINICAL DATA:  Chronic left knee pain.  No known injury  EXAM: MRI OF THE LOWER LEFT JOINT WITHOUT CONTRAST  TECHNIQUE: Multiplanar, multisequence MR imaging of the left knee was performed.  No intravenous contrast was administered.  COMPARISON:  X-ray 06/21/2023  FINDINGS: MENISCI  Medial meniscus:  Intact.  Lateral meniscus:  Intact.  LIGAMENTS  Cruciates: Intact ACL and PCL.  Collaterals: Intact MCL. Lateral collateral ligament complex intact.  CARTILAGE  Patellofemoral:  No chondral defect.  Medial:  No chondral defect.  Lateral: Partial thickness cartilage irregularity of the central weight-bearing lateral femoral condyle.  MISCELLANEOUS  Joint:  Trace knee joint effusion. Fat pads within normal limits.  Popliteal Fossa:  Small Baker's cyst. Intact popliteus tendon.  Extensor Mechanism:  Intact quadriceps and patellar tendons.  Bones: No acute fracture. No dislocation. No bone marrow edema. No marrow replacing bone lesion.  Other: No significant periarticular soft tissue findings. Procedure Note  Plundo, Lenda Kelp, DO - 10/18/2023 Formatting of this note might be different from the original. CLINICAL DATA:  Chronic left knee pain.  No known injury  EXAM: MRI OF THE LOWER LEFT JOINT WITHOUT CONTRAST  TECHNIQUE: Multiplanar, multisequence MR imaging of the left knee was performed. No intravenous contrast was administered.  COMPARISON:  X-ray 06/21/2023  FINDINGS: MENISCI  Medial meniscus:  Intact.  Lateral meniscus:  Intact.  LIGAMENTS  Cruciates: Intact ACL and PCL.  Collaterals: Intact MCL. Lateral collateral ligament complex intact.  CARTILAGE  Patellofemoral:  No chondral defect.  Medial:  No chondral defect.  Lateral: Partial thickness cartilage irregularity of the central weight-bearing lateral femoral condyle.  MISCELLANEOUS  Joint:  Trace knee joint effusion. Fat pads within normal limits.  Popliteal Fossa:  Small Baker's cyst. Intact popliteus tendon.  Extensor Mechanism:  Intact quadriceps and patellar tendons.  Bones: No acute fracture. No dislocation. No bone marrow edema. No marrow replacing bone lesion.  Other: No significant periarticular soft tissue findings.  IMPRESSION: 1. No internal derangement of the left knee. 2. Partial thickness cartilage irregularity of the lateral femoral condyle. 3. Trace knee joint effusion and small Baker's cyst.   Electronically Signed   By: Duanne Guess D.O.   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   Abdominal pain, epigastric 12/01/2021    Otalgia of left ear 09/28/2021   Elevated blood-pressure reading without diagnosis of hypertension 09/28/2021   Need for lipid screening 09/28/2021   Acute right ankle pain 09/25/2021   Cholesteatoma of ear, left 2023   Pyelonephritis 07/04/2021   Chest pain 10/25/2020   Family history of skin cancer 08/02/2020   Lesion of skin of nose 08/02/2020   Sciatica of left side associated with disorder of lumbar spine 04/16/2020   RUQ pain 08/03/2019   Chronic GERD 06/08/2019   Cervical spondylosis without myelopathy 04/26/2017   Spondylolysis, lumbar region 04/26/2017   Past Medical History:  Diagnosis Date   Anxiety    Cholesteatoma of ear, left 2023   COVID-19 04/05/2020   RUQ pain 08/03/2019   Added automatically from request for surgery [id number]    Family History  Problem Relation Age of Onset   Skin cancer Mother    Melanoma Father     Past Surgical  History:  Procedure Laterality Date   BIOPSY  08/31/2019   Procedure: BIOPSY;  Surgeon: Malissa Hippo, MD;  Location: [facility];  Service: Endoscopy;;  duodenum, gastric   ESOPHAGOGASTRODUODENOSCOPY N/A 08/31/2019   Procedure: ESOPHAGOGASTRODUODENOSCOPY (EGD);  Surgeon: Malissa Hippo, MD;  Location: [facility];  Service: Endoscopy;  Laterality: N/A;  2:50   knee surgery Left    LUMBAR SPINE SURGERY     2014   MIDDLE EAR SURGERY Left 2023   Social History   Occupational History   Occupation: Chief Financial Officer    Comment: tree trimmer   Tobacco Use   Smoking status: Never    Passive exposure: Never   Smokeless tobacco: Never  Vaping Use   Vaping status: Never Used  Substance and Sexual Activity   Alcohol use: Yes    Comment: occasional   Drug use: Never   Sexual activity: Yes

## 2023-11-17 ENCOUNTER — Ambulatory Visit: Payer: Self-pay

## 2023-11-19 ENCOUNTER — Encounter: Payer: Self-pay | Admitting: Internal Medicine

## 2023-11-19 ENCOUNTER — Ambulatory Visit (INDEPENDENT_AMBULATORY_CARE_PROVIDER_SITE_OTHER): Payer: Self-pay | Admitting: Internal Medicine

## 2023-11-19 VITALS — BP 128/77 | HR 104 | Ht 72.0 in | Wt 180.0 lb

## 2023-11-19 DIAGNOSIS — F419 Anxiety disorder, unspecified: Secondary | ICD-10-CM | POA: Diagnosis not present

## 2023-11-19 MED ORDER — FLUOXETINE HCL 20 MG PO TABS: 20.0000 mg | ORAL_TABLET | Freq: Every day | ORAL | 2 refills | Status: DC

## 2023-11-19 NOTE — Progress Notes (Signed)
 Acute Office Visit  Subjective:     [name], male    DOB: October 10, 1990, 33 y.o.   MRN: [id number]  Chief Complaint  Patient presents with   Anxiety    Increase anxiety    Mr. Maynes presents today for an acute visit in the setting of anxiety.  He endorses a history of anxiety with panic attacks, correlating with his parents divorced a year and a half ago.  He describes worrying frequently increased irritability, and just being in a bad mood in general.  It is beginning to affect his own marriage and he is interested in medication options to help his symptoms.  He has tried speaking with his pastor and a Veterinary surgeon provided through his insurance company.  Panic attacks have previously-twice weekly.  They have decreased in frequency as of late, with his last panic attack occurring 1 month ago.  He denies a history of anxiety prior to the last year and a half.  Review of Systems  Psychiatric/Behavioral:  The patient is nervous/anxious.   All other systems reviewed and are negative.     Objective:    BP 128/77 (BP Location: Left Arm, Patient Position: Sitting, Cuff Size: Normal)   Pulse (!) 104   Ht 6' (1.829 m)   Wt 180 lb (81.6 kg)   SpO2 98%   BMI 24.41 kg/m   Physical Exam Vitals reviewed.  Constitutional:      General: He is not in acute distress.    Appearance: Normal appearance. He is not ill-appearing.  HENT:     Head: Normocephalic and atraumatic.     Right Ear: External ear normal.     Left Ear: External ear normal.     Nose: Nose normal. No congestion or rhinorrhea.     Mouth/Throat:     Mouth: Mucous membranes are moist.     Pharynx: Oropharynx is clear.  Eyes:     General: No scleral icterus.    Extraocular Movements: Extraocular movements intact.     Conjunctiva/sclera: Conjunctivae normal.     Pupils: Pupils are equal, round, and reactive to light.  Cardiovascular:     Rate and Rhythm: Normal rate and regular rhythm.     Pulses: Normal  pulses.     Heart sounds: Normal heart sounds. No murmur heard. Pulmonary:     Effort: Pulmonary effort is normal.     Breath sounds: Normal breath sounds. No wheezing, rhonchi or rales.  Abdominal:     General: Abdomen is flat. Bowel sounds are normal. There is no distension.     Palpations: Abdomen is soft.     Tenderness: There is no abdominal tenderness.  Musculoskeletal:        General: No swelling or deformity. Normal range of motion.     Cervical back: Normal range of motion.  Skin:    General: Skin is warm and dry.     Capillary Refill: Capillary refill takes less than 2 seconds.  Neurological:     General: No focal deficit present.     Mental Status: He is alert and oriented to person, place, and time.     Motor: No weakness.  Psychiatric:        Mood and Affect: Mood normal.        Behavior: Behavior normal.        Thought Content: Thought content normal.       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  He has tried counseling services and speaking with his pastor but still feels irritable, worries frequently, and it is beginning to affect his marriage.  He is interested in medication options to help his symptoms.  GAD-7 score is elevated at 11 today.  PHQ score 9. -Treatment options reviewed.  Through shared decision making, fluoxetine 20 mg daily has been prescribed today.  Okay to increase to 40 mg daily after 1 week.  We will tentatively plan for follow-up in 3 months.      Meds ordered this encounter  Medications   FLUoxetine (PROZAC) 20 MG tablet    Sig: Take 1 tablet (20 mg total) by mouth daily.    Dispense:  60 tablet    Refill:  2    Return in about 3 months (around 02/18/2024).  Billie Lade, MD

## 2023-11-19 NOTE — Assessment & Plan Note (Signed)
 Presenting today for an acute visit in the setting of anxiety with panic attacks for the last year and a half, which is around the time his parents divorced.  He has tried counseling services and speaking with his pastor but still feels irritable, worries frequently, and it is beginning to affect his marriage.  He is interested in medication options to help his symptoms.  GAD-7 score is elevated at 11 today.  PHQ score 9. -Treatment options reviewed.  Through shared decision making, fluoxetine 20 mg daily has been prescribed today.  Okay to increase to 40 mg daily after 1 week.  We will tentatively plan for follow-up in 3 months.

## 2023-11-19 NOTE — Patient Instructions (Signed)
 It was a pleasure to see you today.  Thank you for giving [place] the opportunity to be involved in your care.  Below is a brief recap of your visit and next steps.  We will plan to see you again in 3 months.  Summary Start fluoxetine 20 mg daily for anxiety Follow up in 3 months

## 2023-12-02 ENCOUNTER — Encounter: Payer: Self-pay | Admitting: Internal Medicine

## 2023-12-02 ENCOUNTER — Other Ambulatory Visit: Payer: Self-pay | Admitting: Family Medicine

## 2023-12-02 DIAGNOSIS — K219 Gastro-esophageal reflux disease without esophagitis: Secondary | ICD-10-CM

## 2024-01-12 ENCOUNTER — Telehealth: Payer: Self-pay

## 2024-01-12 NOTE — Telephone Encounter (Signed)
 Mailbox full

## 2024-01-12 NOTE — Telephone Encounter (Signed)
 Copied from CRM [id number]. Topic: Clinical - Medical Advice >> Jan 12, 2024  2:17 PM Albertha Alosa wrote: Reason for CRM: Patient called in wanting to speak with side effects regarding FLUoxetine  (PROZAC ) 20 MG tablet , needs them refilled but has some concerns that he would like to speak with Dr.Dixon or his nurses about would like a callback regarding this    [phone]

## 2024-01-13 ENCOUNTER — Encounter: Payer: Self-pay | Admitting: Internal Medicine

## 2024-01-13 ENCOUNTER — Telehealth (INDEPENDENT_AMBULATORY_CARE_PROVIDER_SITE_OTHER): Admitting: Internal Medicine

## 2024-01-13 DIAGNOSIS — F419 Anxiety disorder, unspecified: Secondary | ICD-10-CM | POA: Diagnosis not present

## 2024-01-13 MED ORDER — DULOXETINE HCL 30 MG PO CPEP: 30.0000 mg | ORAL_CAPSULE | Freq: Every day | ORAL | 3 refills | Status: DC

## 2024-01-13 NOTE — Progress Notes (Signed)
   Virtual Visit via Video Note  I connected with Ima Maltos on 01/13/24 at 11:40 AM EDT by a video enabled telemedicine application and verified that I am speaking with the correct person using two identifiers.  Patient Location: Home Provider Location: Office/Clinic  I discussed the limitations, risks, security, and privacy concerns of performing an evaluation and management service by video and the availability of in person appointments. I also discussed with the patient that there may be a patient responsible charge related to this service. The patient expressed understanding and agreed to proceed.  Subjective: PCP: Tobi Fortes, MD  Chief Complaint  Patient presents with   Anxiety    Medication side effects   Mr. Corp has been evaluated today for an acute visit through video encounter in the setting of anxiety with panic attacks with concern for adverse medication side effects.  He was last evaluated by me 4/4 for an acute visit at which time he endorsed anxiety with panic attacks for the last 18 months.  Ultimately, fluoxetine  was started.  While he says it has been effective in managing his anxiety, Mr. Redditt has noted decreased libido and erectile dysfunction while taking fluoxetine .  He is struggling with intimacy with his wife and would like to explore other medication options.  He does not have any additional concerns to discuss today.  ROS: Per HPI  Current Outpatient Medications:    DULoxetine (CYMBALTA) 30 MG capsule, Take 1 capsule (30 mg total) by mouth daily., Disp: 30 capsule, Rfl: 3   albuterol  (VENTOLIN  HFA) 108 (90 Base) MCG/ACT inhaler, Inhale 2 puffs into the lungs every 6 (six) hours as needed for wheezing or shortness of breath., Disp: 18 g, Rfl: 0   cetirizine  (ZYRTEC ) 10 MG tablet, Take 1 tablet (10 mg total) by mouth daily., Disp: 30 tablet, Rfl: 11   cyclobenzaprine  (FLEXERIL ) 5 MG tablet, Take 1 tablet (5 mg total) by mouth 3 (three) times daily as  needed. (Patient not taking: Reported on 07/19/2023), Disp: 30 tablet, Rfl: 1   fluticasone  (FLONASE ) 50 MCG/ACT nasal spray, Place 2 sprays into both nostrils daily., Disp: 16 g, Rfl: 6   pantoprazole  (PROTONIX ) 40 MG tablet, TAKE 1 TABLET(40 MG) BY MOUTH DAILY, Disp: 90 tablet, Rfl: 1  Assessment and Plan:  Anxiety disorder with panic attacks Assessment & Plan: Evaluated today for an acute visit through video encounter with concern for adverse side effects from fluoxetine  as noted above.  Anxiety has been controlled, but he is experiencing sexual dysfunction.  We reviewed additional treatment options.  Through shared decision making, duloxetine 30 mg daily has been prescribed today.  Okay to increase to 60 mg daily after 1 week.  He will return to care if symptoms worsen or fail to improve.  Otherwise, he is currently scheduled for routine follow-up in July.  Follow Up Instructions: Return if symptoms worsen or fail to improve.   I discussed the assessment and treatment plan with the patient. The patient was provided an opportunity to ask questions, and all were answered. The patient agreed with the plan and demonstrated an understanding of the instructions.   The patient was advised to call back or seek an in-person evaluation if the symptoms worsen or if the condition fails to improve as anticipated.  The above assessment and management plan was discussed with the patient. The patient verbalized understanding of and has agreed to the management plan.   Tobi Fortes, MD

## 2024-01-13 NOTE — Assessment & Plan Note (Signed)
 Evaluated today for an acute visit through video encounter with concern for adverse side effects from fluoxetine  as noted above.  Anxiety has been controlled, but he is experiencing sexual dysfunction.  We reviewed additional treatment options.  Through shared decision making, duloxetine 30 mg daily has been prescribed today.  Okay to increase to 60 mg daily after 1 week.  He will return to care if symptoms worsen or fail to improve.  Otherwise, he is currently scheduled for routine follow-up in July.

## 2024-02-18 ENCOUNTER — Encounter (HOSPITAL_COMMUNITY): Payer: Self-pay

## 2024-02-18 ENCOUNTER — Other Ambulatory Visit: Payer: Self-pay

## 2024-02-18 ENCOUNTER — Emergency Department (HOSPITAL_COMMUNITY)
Admission: EM | Admit: 2024-02-18 | Discharge: 2024-02-18 | Disposition: A | Discharge status: Home / Self Care | Attending: Emergency Medicine | Admitting: Emergency Medicine

## 2024-02-18 DIAGNOSIS — R110 Nausea: Secondary | ICD-10-CM | POA: Diagnosis not present

## 2024-02-18 DIAGNOSIS — M791 Myalgia, unspecified site: Secondary | ICD-10-CM | POA: Insufficient documentation

## 2024-02-18 DIAGNOSIS — R112 Nausea with vomiting, unspecified: Secondary | ICD-10-CM | POA: Insufficient documentation

## 2024-02-18 DIAGNOSIS — E860 Dehydration: Secondary | ICD-10-CM | POA: Diagnosis not present

## 2024-02-18 LAB — COMPREHENSIVE METABOLIC PANEL WITH GFR
ALT: 30 U/L (ref 0–44)
AST: 37 U/L (ref 15–41)
Albumin: 5.4 g/dL — ABNORMAL HIGH (ref 3.5–5.0)
Alkaline Phosphatase: 82 U/L (ref 38–126)
Anion gap: 14 (ref 5–15)
BUN: 25 mg/dL — ABNORMAL HIGH (ref 6–20)
CO2: 29 mmol/L (ref 22–32)
Calcium: 10 mg/dL (ref 8.9–10.3)
Chloride: 94 mmol/L — ABNORMAL LOW (ref 98–111)
Creatinine, Ser: 1.09 mg/dL (ref 0.61–1.24)
GFR, Estimated: >60 mL/min (ref >60)
Glucose, Bld: 100 mg/dL — ABNORMAL HIGH (ref 70–99)
Potassium: 3.9 mmol/L (ref 3.5–5.1)
Sodium: 137 mmol/L (ref 135–145)
Total Bilirubin: 1.3 mg/dL — ABNORMAL HIGH (ref 0.0–1.2)
Total Protein: 8.5 g/dL — ABNORMAL HIGH (ref 6.5–8.1)

## 2024-02-18 LAB — CBC WITH DIFFERENTIAL/PLATELET
Abs Immature Granulocytes: 0.02 K/uL (ref 0.00–0.07)
Basophils Absolute: 0.1 K/uL (ref 0.0–0.1)
Basophils Relative: 1 %
Eosinophils Absolute: 0.1 K/uL (ref 0.0–0.5)
Eosinophils Relative: 1 %
HCT: 51.7 % (ref 39.0–52.0)
Hemoglobin: 18.1 g/dL — ABNORMAL HIGH (ref 13.0–17.0)
Immature Granulocytes: 0 %
Lymphocytes Relative: 21 %
Lymphs Abs: 1.8 K/uL (ref 0.7–4.0)
MCH: 31.6 pg (ref 26.0–34.0)
MCHC: 35 g/dL (ref 30.0–36.0)
MCV: 90.2 fL (ref 80.0–100.0)
Monocytes Absolute: 0.5 K/uL (ref 0.1–1.0)
Monocytes Relative: 6 %
Neutro Abs: 6.2 K/uL (ref 1.7–7.7)
Neutrophils Relative %: 71 %
Platelets: 369 K/uL (ref 150–400)
RBC: 5.73 MIL/uL (ref 4.22–5.81)
RDW: 12.1 % (ref 11.5–15.5)
WBC: 8.6 K/uL (ref 4.0–10.5)
nRBC: 0 % (ref 0.0–0.2)

## 2024-02-18 LAB — URINALYSIS, ROUTINE W REFLEX MICROSCOPIC
Bacteria, UA: NONE SEEN
Glucose, UA: NEGATIVE mg/dL
Hgb urine dipstick: NEGATIVE
Ketones, ur: 5 mg/dL — AB
Leukocytes,Ua: NEGATIVE
Nitrite: NEGATIVE
Protein, ur: >=300 mg/dL — AB
Specific Gravity, Urine: 1.029 (ref 1.005–1.030)
pH: 7 (ref 5.0–8.0)

## 2024-02-18 LAB — CK: Total CK: 249 U/L (ref 49–397)

## 2024-02-18 MED ORDER — SODIUM CHLORIDE 0.9 % IV BOLUS
1000.0000 mL | Freq: Once | INTRAVENOUS | Status: AC
  Administered 2024-02-18: 1000 mL via INTRAVENOUS

## 2024-02-18 MED ORDER — ONDANSETRON 4 MG PO TBDP: 4.0000 mg | ORAL_TABLET | Freq: Three times a day (TID) | ORAL | 0 refills | Status: AC | PRN

## 2024-02-18 MED ORDER — ONDANSETRON HCL 4 MG/2ML IJ SOLN
4.0000 mg | Freq: Once | INTRAMUSCULAR | Status: AC
  Administered 2024-02-18: 4 mg via INTRAVENOUS
  Filled 2024-02-18: qty 2

## 2024-02-18 NOTE — ED Provider Notes (Signed)
 [facility] AT [facility] Provider Note   CSN: [id number] Arrival date & time: 02/18/24  1202     Patient presents with: Dehydration   Matthew Andrews is a 33 y.o. male.   Patient is a 33 year old male who presents emergency department chief complaint of body cramps, nausea and feeling dehydrated.  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  Patient notes that yesterday he did start to experience nausea and vomiting and has been unable to keep any food or liquids down this morning.  He denies any abdominal pain or diarrhea.  He denies dizziness, lightheadedness or syncope.  He denies any chest pain or shortness of breath.        Prior to Admission medications   Medication Sig Start Date End Date Taking? Authorizing Provider  ondansetron  (ZOFRAN -ODT) 4 MG disintegrating tablet Take 2 mg by mouth every 6 (six) hours as needed. 10/06/23  Yes [provider]  albuterol  (VENTOLIN  HFA) 108 (90 Base) MCG/ACT inhaler Inhale 2 puffs into the lungs every 6 (six) hours as needed for wheezing or shortness of breath. 09/11/22   Tobie Suzzane POUR, MD  cetirizine  (ZYRTEC ) 10 MG tablet Take 1 tablet (10 mg total) by mouth daily. 09/11/22   Tobie Suzzane POUR, MD  cyclobenzaprine  (FLEXERIL ) 5 MG tablet Take 1 tablet (5 mg total) by mouth 3 (three) times daily as needed. Patient not taking: Reported on 07/19/2023 06/21/23   Del Orbe Polanco, Iliana, FNP  DULoxetine  (CYMBALTA ) 30 MG capsule Take 1 capsule (30 mg total) by mouth daily. 01/13/24   Melvenia Manus BRAVO, MD  fluticasone  (FLONASE ) 50 MCG/ACT nasal spray Place 2 sprays into both nostrils daily. 11/18/22   Melvenia Manus BRAVO, MD  pantoprazole  (PROTONIX ) 40 MG tablet TAKE 1 TABLET(40 MG) BY MOUTH DAILY 12/02/23   Dixon, Phillip E, MD    Allergies: Patient has no known allergies.    Review of Systems  Gastrointestinal:  Positive for nausea.  All other systems reviewed and are  negative.   Updated Vital Signs BP (!) 142/98   Pulse 98   Temp 98.3 F (36.8 C) (Oral)   Resp 17   Ht 6' (1.829 m)   Wt 81.6 kg   SpO2 95%   BMI 24.41 kg/m   Physical Exam Vitals and nursing note reviewed.  Constitutional:      Appearance: Normal appearance.  HENT:     Head: Normocephalic and atraumatic.     Nose: Nose normal.     Mouth/Throat:     Mouth: Mucous membranes are moist.  Eyes:     Extraocular Movements: Extraocular movements intact.     Conjunctiva/sclera: Conjunctivae normal.     Pupils: Pupils are equal, round, and reactive to light.  Cardiovascular:     Rate and Rhythm: Normal rate and regular rhythm.     Pulses: Normal pulses.     Heart sounds: Normal heart sounds. No murmur heard.    No gallop.  Pulmonary:     Effort: Pulmonary effort is normal. No respiratory distress.     Breath sounds: Normal breath sounds. No stridor. No wheezing, rhonchi or rales.  Abdominal:     General: Abdomen is flat. Bowel sounds are normal. There is no distension.     Palpations: Abdomen is soft.     Tenderness: There is no abdominal tenderness. There is no guarding.  Musculoskeletal:        General: Normal  range of motion.     Cervical back: Normal range of motion and neck supple. No rigidity or tenderness.  Skin:    General: Skin is warm and dry.     Findings: No bruising or rash.  Neurological:     General: No focal deficit present.     Mental Status: He is alert and oriented to person, place, and time. Mental status is at baseline.     Cranial Nerves: No cranial nerve deficit.     Sensory: No sensory deficit.     Motor: No weakness.     Coordination: Coordination normal.     Gait: Gait normal.  Psychiatric:        Mood and Affect: Mood normal.        Behavior: Behavior normal.        Thought Content: Thought content normal.        Judgment: Judgment normal.     (all labs ordered are listed, but only abnormal results are displayed) Labs Reviewed   COMPREHENSIVE METABOLIC PANEL WITH GFR - Abnormal; Notable for the following components:      Result Value   Chloride 94 (*)    Glucose, Bld 100 (*)    BUN 25 (*)    Total Protein 8.5 (*)    Albumin 5.4 (*)    Total Bilirubin 1.3 (*)    All other components within normal limits  CBC WITH DIFFERENTIAL/PLATELET - Abnormal; Notable for the following components:   Hemoglobin 18.1 (*)    All other components within normal limits  URINALYSIS, ROUTINE W REFLEX MICROSCOPIC - Abnormal; Notable for the following components:   Color, Urine AMBER (*)    Bilirubin Urine SMALL (*)    Ketones, ur 5 (*)    Protein, ur >=300 (*)    All other components within normal limits  CK    EKG: None  Radiology: No results found.   Procedures   Medications Ordered in the ED  sodium chloride  0.9 % bolus 1,000 mL (1,000 mLs Intravenous New Bag/Given 02/18/24 1231)  ondansetron  (ZOFRAN ) injection 4 mg (4 mg Intravenous Given 02/18/24 1233)  sodium chloride  0.9 % bolus 1,000 mL (1,000 mLs Intravenous New Bag/Given 02/18/24 1338)                                    Medical Decision Making Amount and/or Complexity of Data Reviewed Labs: ordered.  Risk Prescription drug management.   This patient presents to the ED for concern of fatigue, nausea, vomiting, body aches differential diagnosis includes dehydration, electrolyte derangement, acute kidney injury, rhabdomyolysis    Additional history obtained:  Additional history obtained from none External records from outside source obtained and reviewed including none   Lab Tests:  I Ordered, and personally interpreted labs.  The pertinent results include: No leukocytosis, no anemia, normal kidney function liver function, normal electrolytes, unremarkable urinalysis, negative CK    Medicines ordered and prescription drug management:  I ordered medication including IV fluids, Zofran  for dehydration, nausea Reevaluation of the patient after these  medicines showed that the patient improved I have reviewed the patients home medicines and have made adjustments as needed   Problem List / ED Course:  Patient is feeling greatly improved and back to his baseline at this time after IV hydration.  Suspect dehydration at this point.  Blood work has been overall unremarkable.  He is not rhabdomyolysis at this  time.  Abdominal exam is benign with no focal tenderness throughout.  Do not suspect an acute intra-abdominal surgical process.  Vital signs are stable with no indication for sepsis.  Discussed the need for continued good oral hydration on outpatient basis.  Will continue symptomatic treatment for his nausea.  Close follow-up with primary care doctor was discussed as well as strict turn precautions for any new or worsening symptoms.  Patient voiced understanding and had no additional questions.   Social Determinants of Health:  None        Final diagnoses:  None    ED Discharge Orders     None          Daralene Lonni JONETTA DEVONNA 02/18/24 1421    Charlyn Sora, MD 02/19/24 2326

## 2024-02-18 NOTE — Discharge Instructions (Signed)
 Please follow-up closely with your primary care doctor on an outpatient basis.  Return to emergency department immediately for any new or worsening symptoms.

## 2024-02-18 NOTE — ED Triage Notes (Signed)
 Patient states he works outside and experienced vomiting since yesterday. Patient states he is not able to keep anything down.

## 2024-02-21 ENCOUNTER — Ambulatory Visit: Payer: Self-pay

## 2024-02-21 VITALS — BP 122/83 | HR 83 | Ht 72.0 in | Wt 175.0 lb

## 2024-02-21 DIAGNOSIS — F419 Anxiety disorder, unspecified: Secondary | ICD-10-CM | POA: Diagnosis not present

## 2024-02-21 MED ORDER — BUPROPION HCL ER (XL) 150 MG PO TB24: 150.0000 mg | ORAL_TABLET | Freq: Every day | ORAL | 3 refills | Status: DC

## 2024-02-21 NOTE — Assessment & Plan Note (Signed)
 We will have him wean off of the Cymbalta  while starting Wellbutrin .  Advised him to watch for worsening of anxiety and or depression.  Recommend f/u in 2-3 months or sooner if needed.

## 2024-02-21 NOTE — Patient Instructions (Signed)
 Reduce dose of Cymbalta  back down to 30 mg every other day while starting on the Wellbutrin .  Call for any adverse side effects or worsening of anxiety and or depression.

## 2024-02-21 NOTE — Progress Notes (Addendum)
 Established Patient Office Visit  Subjective   [name], male    DOB: 1991/04/06  Age: 33 y.o. MRN: [id number]  Chief Complaint  Patient presents with   Anxiety    Follow up , patient doesn't feel the cymbalta  is working as well. Is causing patient to sweat tremendously.    Follow-up    ER follow up from being dehydrated    Anxiety, Follow-up  He was last seen for anxiety 2 months ago. Changes made at last visit include stopping Prozac  and adding Cymbalta .   He reports good compliance with treatment. He reports poor tolerance of treatment. He is having side effects. Excessive sweating and sexual side effects  He feels his anxiety is mild and Unchanged since last visit.  Symptoms: No chest pain No difficulty concentrating  Yes dizziness Yes fatigue  No feelings of losing control No insomnia  Yes irritable No palpitations  Yes panic attacks No racing thoughts  No shortness of breath Yes sweating  No tremors/shakes    GAD-7 Results    11/19/2023    2:12 PM 06/21/2023    3:40 PM 11/23/2022    3:34 PM  GAD-7 Generalized Anxiety Disorder Screening Tool  1. Feeling Nervous, Anxious, or on Edge 2 1 0  2. Not Being Able to Stop or Control Worrying 2 0 0  3. Worrying Too Much About Different Things 2 1 0  4. Trouble Relaxing 1 0 0  5. Being So Restless it's Hard To Sit Still 1 0 0  6. Becoming Easily Annoyed or Irritable 2 0 0  7. Feeling Afraid As If Something Awful Might Happen 1 0 0  Total GAD-7 Score 11 2 0  Difficulty At Work, Home, or Getting  Along With Others? Somewhat difficult Not difficult at all Not difficult at all    PHQ-9 Scores    11/19/2023    1:45 PM 06/21/2023    3:40 PM 11/23/2022    3:34 PM  PHQ9 SCORE ONLY  PHQ-9 Total Score 9 2 0    ---------------------------------------------------------------------------------------------------   Patient Active Problem List   Diagnosis Date Noted   Anxiety disorder with panic attacks  11/19/2023   Pain in left knee 06/21/2023   Lower abdominal pain 11/23/2022   Reactive airway disease 09/11/2022   Allergic rhinitis 09/11/2022   Oropharyngeal candidiasis 05/27/2022   Vitamin D  deficiency 05/27/2022   Annual physical exam 04/24/2022   Throat irritation 04/24/2022   Need for immunization against influenza 04/24/2022   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 01/23/2022   Abdominal pain, epigastric 12/01/2021   Otalgia of left ear 09/28/2021   Elevated blood-pressure reading without diagnosis of hypertension 09/28/2021   Need for lipid screening 09/28/2021   Acute right ankle pain 09/25/2021   Cholesteatoma of ear, left 2023   Pyelonephritis 07/04/2021   Chest pain 10/25/2020   Family history of skin cancer 08/02/2020   Lesion of skin of nose 08/02/2020   Sciatica of left side associated with disorder of lumbar spine 04/16/2020   RUQ pain 08/03/2019   Chronic GERD 06/08/2019   Cervical spondylosis without myelopathy 04/26/2017   Spondylolysis, lumbar region 04/26/2017      ROS    Objective:     BP 122/83   Pulse 83   Ht 6' (1.829 m)   Wt 175 lb (79.4 kg)   SpO2 96%   BMI 23.73 kg/m  BP Readings from Last 3 Encounters:  02/21/24 122/83  02/18/24 120/84  11/19/23 128/77   Wt Readings from Last 3 Encounters:  02/21/24 175 lb (79.4 kg)  02/18/24 180 lb (81.6 kg)  11/19/23 180 lb (81.6 kg)      Physical Exam Vitals and nursing note reviewed.  Constitutional:      Appearance: Normal appearance.  HENT:     Head: Normocephalic.  Eyes:     Extraocular Movements: Extraocular movements intact.     Pupils: Pupils are equal, round, and reactive to light.  Cardiovascular:     Rate and Rhythm: Normal rate and regular rhythm.  Pulmonary:     Effort: Pulmonary effort is normal.     Breath sounds: Normal breath sounds.  Musculoskeletal:     Cervical back: Normal range of motion and neck supple.  Neurological:     Mental Status: He is  alert and oriented to person, place, and time.  Psychiatric:        Mood and Affect: Mood normal.        Thought Content: Thought content normal.      No results found for any visits on 02/21/24.  Last CBC Lab Results  Component Value Date   WBC 8.6 02/18/2024   HGB 18.1 (H) 02/18/2024   HCT 51.7 02/18/2024   MCV 90.2 02/18/2024   MCH 31.6 02/18/2024   RDW 12.1 02/18/2024   PLT 369 02/18/2024   Last metabolic panel Lab Results  Component Value Date   GLUCOSE 100 (H) 02/18/2024   NA 137 02/18/2024   K 3.9 02/18/2024   CL 94 (L) 02/18/2024   CO2 29 02/18/2024   BUN 25 (H) 02/18/2024   CREATININE 1.09 02/18/2024   GFRNONAA >60 02/18/2024   CALCIUM 10.0 02/18/2024   PROT 8.5 (H) 02/18/2024   ALBUMIN 5.4 (H) 02/18/2024   LABGLOB 2.3 11/23/2022   AGRATIO 2.0 11/23/2022   BILITOT 1.3 (H) 02/18/2024   ALKPHOS 82 02/18/2024   AST 37 02/18/2024   ALT 30 02/18/2024   ANIONGAP 14 02/18/2024   Last lipids Lab Results  Component Value Date   CHOL 173 04/24/2022   HDL 43 04/24/2022   LDLCALC 77 04/24/2022   TRIG 332 (H) 04/24/2022   CHOLHDL 4.0 04/24/2022   Last hemoglobin A1c Lab Results  Component Value Date   HGBA1C 5.4 04/24/2022   Last thyroid functions Lab Results  Component Value Date   TSH 1.490 04/24/2022      The ASCVD Risk score (Arnett DK, et al., 2019) failed to calculate for the following reasons:   The 2019 ASCVD risk score is only valid for ages 79 to 65    Assessment & Plan:   Problem List Items Addressed This Visit       Other   Anxiety disorder with panic attacks - Primary   We will have him wean off of the Cymbalta  while starting Wellbutrin .  Advised him to watch for worsening of anxiety and or depression.  Recommend f/u in 2-3 months or sooner if needed.       Relevant Medications   buPROPion  (WELLBUTRIN  XL) 150 MG 24 hr tablet    Return in about 3 months (around 05/23/2024) for chronic follow-up with PCP, for recheck of symptoms.     Leita Longs, FNP

## 2024-04-04 ENCOUNTER — Telehealth: Payer: Self-pay

## 2024-04-04 NOTE — Telephone Encounter (Signed)
 Copied from CRM [id number]. Topic: Clinical - Medication Question >> Apr 04, 2024  9:16 AM Wess RAMAN wrote: Reason for CRM: Patient states he does not think buPROPion  (WELLBUTRIN  XL) 150 MG 24 hr tablet is working. It is causing more of an agitation. He would like something else sent in to pharmacy.   Callback #: [id number]  Pharmacy: [facility] - [place], [place] - [address] [place] [address] Phone: [phone] Fax: [phone] Hours: Not open 24 hours

## 2024-04-06 ENCOUNTER — Other Ambulatory Visit: Payer: Self-pay

## 2024-04-06 DIAGNOSIS — F419 Anxiety disorder, unspecified: Secondary | ICD-10-CM

## 2024-04-06 MED ORDER — SERTRALINE HCL 50 MG PO TABS: 50.0000 mg | ORAL_TABLET | Freq: Every day | ORAL | 3 refills | Status: DC

## 2024-04-09 ENCOUNTER — Other Ambulatory Visit: Payer: Self-pay | Admitting: Internal Medicine

## 2024-05-31 ENCOUNTER — Encounter (INDEPENDENT_AMBULATORY_CARE_PROVIDER_SITE_OTHER): Payer: Self-pay | Admitting: Gastroenterology

## 2024-06-19 ENCOUNTER — Encounter: Payer: Self-pay | Admitting: Radiology

## 2024-06-20 ENCOUNTER — Ambulatory Visit (INDEPENDENT_AMBULATORY_CARE_PROVIDER_SITE_OTHER): Payer: Self-pay | Admitting: Internal Medicine

## 2024-06-20 ENCOUNTER — Encounter: Payer: Self-pay | Admitting: Internal Medicine

## 2024-06-20 VITALS — BP 128/82 | HR 102 | Ht 72.0 in | Wt 178.2 lb

## 2024-06-20 DIAGNOSIS — N529 Male erectile dysfunction, unspecified: Secondary | ICD-10-CM | POA: Insufficient documentation

## 2024-06-20 DIAGNOSIS — F4310 Post-traumatic stress disorder, unspecified: Secondary | ICD-10-CM | POA: Diagnosis not present

## 2024-06-20 DIAGNOSIS — E538 Deficiency of other specified B group vitamins: Secondary | ICD-10-CM

## 2024-06-20 DIAGNOSIS — F419 Anxiety disorder, unspecified: Secondary | ICD-10-CM | POA: Diagnosis not present

## 2024-06-20 DIAGNOSIS — E559 Vitamin D deficiency, unspecified: Secondary | ICD-10-CM

## 2024-06-20 MED ORDER — TADALAFIL 20 MG PO TABS: 20.0000 mg | ORAL_TABLET | Freq: Every day | ORAL | 2 refills | Status: AC | PRN

## 2024-06-20 MED ORDER — HYDROXYZINE PAMOATE 25 MG PO CAPS: 25.0000 mg | ORAL_CAPSULE | Freq: Three times a day (TID) | ORAL | 2 refills | Status: AC | PRN

## 2024-06-20 MED ORDER — FLUOXETINE HCL 20 MG PO CAPS: 20.0000 mg | ORAL_CAPSULE | Freq: Every day | ORAL | 3 refills | Status: AC

## 2024-06-20 NOTE — Patient Instructions (Signed)
 Please start taking Prozac  as prescribed after 2 days.  Please take Hydroxyzine as needed for anxiety.
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
## 2024-06-20 NOTE — Assessment & Plan Note (Signed)
 Uncontrolled currently Has tried Cymbalta , Wellbutrin  and Zoloft  Restart Prozac  20 mg QD as he had responded well to it Cialis as needed for erectile dysfunction/anorgasmia caused by Prozac  Hydroxyzine as needed for severe anxiety/panic episode Advised to maintain adequate hydration and eat at regular intervals Check CBC, CMP, TSH, vitamin D  and vitamin B12 Referred to [facility] therapy
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
## 2024-06-20 NOTE — Assessment & Plan Note (Signed)
 Had erectile dysfunction with Prozac  Cialis prescribed

## 2024-06-21 ENCOUNTER — Ambulatory Visit: Payer: Self-pay | Admitting: Internal Medicine

## 2024-06-21 LAB — CBC WITH DIFFERENTIAL/PLATELET
Basophils Absolute: 0 x10E3/uL (ref 0.0–0.2)
Basos: 1 %
EOS (ABSOLUTE): 0 x10E3/uL (ref 0.0–0.4)
Eos: 0 %
Hematocrit: 41.3 % (ref 37.5–51.0)
Hemoglobin: 14.1 g/dL (ref 13.0–17.7)
Immature Grans (Abs): 0 x10E3/uL (ref 0.0–0.1)
Immature Granulocytes: 0 %
Lymphocytes Absolute: 1.8 x10E3/uL (ref 0.7–3.1)
Lymphs: 38 %
MCH: 31.5 pg (ref 26.6–33.0)
MCHC: 34.1 g/dL (ref 31.5–35.7)
MCV: 92 fL (ref 79–97)
Monocytes Absolute: 0.4 x10E3/uL (ref 0.1–0.9)
Monocytes: 8 %
Neutrophils Absolute: 2.5 x10E3/uL (ref 1.4–7.0)
Neutrophils: 53 %
Platelets: 292 x10E3/uL (ref 150–450)
RBC: 4.47 x10E6/uL (ref 4.14–5.80)
RDW: 12.3 % (ref 11.6–15.4)
WBC: 4.7 x10E3/uL (ref 3.4–10.8)

## 2024-06-21 LAB — CMP14+EGFR
ALT: 14 IU/L (ref 0–44)
AST: 19 IU/L (ref 0–40)
Albumin: 5 g/dL (ref 4.1–5.1)
Alkaline Phosphatase: 74 IU/L (ref 47–123)
BUN/Creatinine Ratio: 13 (ref 9–20)
BUN: 12 mg/dL (ref 6–20)
Bilirubin Total: 0.3 mg/dL (ref 0.0–1.2)
CO2: 25 mmol/L (ref 20–29)
Calcium: 9.7 mg/dL (ref 8.7–10.2)
Chloride: 102 mmol/L (ref 96–106)
Creatinine, Ser: 0.93 mg/dL (ref 0.76–1.27)
Globulin, Total: 1.9 g/dL (ref 1.5–4.5)
Glucose: 116 mg/dL — ABNORMAL HIGH (ref 70–99)
Potassium: 3.6 mmol/L (ref 3.5–5.2)
Sodium: 142 mmol/L (ref 134–144)
Total Protein: 6.9 g/dL (ref 6.0–8.5)
eGFR: 111 mL/min/1.73 (ref >59)

## 2024-06-21 LAB — TSH+FREE T4
Free T4: 1 ng/dL (ref 0.82–1.77)
TSH: 1.39 u[IU]/mL (ref 0.450–4.500)

## 2024-06-21 LAB — VITAMIN B12: Vitamin B-12: 700 pg/mL (ref 232–1245)

## 2024-06-21 LAB — VITAMIN D 25 HYDROXY (VIT D DEFICIENCY, FRACTURES): Vit D, 25-Hydroxy: 33 ng/mL (ref 30.0–100.0)

## 2024-06-26 ENCOUNTER — Encounter (HOSPITAL_COMMUNITY): Payer: Self-pay | Admitting: Emergency Medicine

## 2024-06-26 ENCOUNTER — Emergency Department (HOSPITAL_COMMUNITY)

## 2024-06-26 ENCOUNTER — Emergency Department (HOSPITAL_COMMUNITY)
Admission: EM | Admit: 2024-06-26 | Discharge: 2024-06-26 | Disposition: A | Discharge status: Home / Self Care | Attending: Emergency Medicine | Admitting: Emergency Medicine

## 2024-06-26 ENCOUNTER — Other Ambulatory Visit: Payer: Self-pay

## 2024-06-26 DIAGNOSIS — R10A3 Flank pain, bilateral: Secondary | ICD-10-CM | POA: Insufficient documentation

## 2024-06-26 DIAGNOSIS — R109 Unspecified abdominal pain: Secondary | ICD-10-CM | POA: Diagnosis not present

## 2024-06-26 NOTE — ED Notes (Signed)
 Pt stated he lives a hour away and has to get company truck back by certain time and can not wait any longer. Encourage pt to stay but requesting to leave due to circumstances. This tech removed armband and pt left the dept.    Daphne Nat Emmer, CNA 06/26/24 [phone]

## 2024-06-26 NOTE — Discharge Instructions (Signed)
 Thank you for visiting the Emergency Department today.  It was a pleasure to be part of your healthcare team.  Your results showed no acute findings. As discussed, your urine and gonorrhea/ chlamydia test is pending. At home, rest, utilize heat, ice, and over-the-counter pain relievers such as Tylenol and ibuprofen  as needed for pain. It is important to watch for warning signs such as worsening pain, fever, or urinary symptoms. If any of these happen, return to the Emergency Department or call 911. Thank you for trusting us  with your health.

## 2024-06-26 NOTE — ED Triage Notes (Addendum)
 Pt reports bilateral flank pain with tenderness to touch and nausea x 3 days, pt reports pain being after having sex with a new lubricant, states he did not void after and is concerned for kidney infection, denies urinary symptoms, went to [facility] today but left before being seen-blood work and urine are resulted in [employer]

## 2024-06-26 NOTE — ED Provider Notes (Signed)
 [place] EMERGENCY DEPARTMENT AT [facility] Provider Note   CSN: [id number] Arrival date & time: 06/26/24  1901     Patient presents with: Flank Pain (/)   Matthew Andrews is a 33 y.o. male who presents to the ED with bilateral flank pain with tenderness to touch x 3 days.  The patient states that the symptoms started after he and his wife had sexual intercourse with a new lubricant and he did not have postcoital urination.  Since then patient endorses some decreased urine output and nausea without vomiting.  Patient denies additional urinary symptoms such as burning, frequency, or blood visualized in urine. Patient denies fevers, abdominal pain, diarrhea. There is no of prior episodes or relevant medical history, including history of kidney stones, UTIs, or prostate abnormalities. No recent travel. No sick contacts. The patient denies STI risk or exposure.    {Add pertinent medical, surgical, social history, OB history to HPI:32947}  Flank Pain       Prior to Admission medications   Medication Sig Start Date End Date Taking? Authorizing Provider  albuterol  (VENTOLIN  HFA) 108 (90 Base) MCG/ACT inhaler Inhale 2 puffs into the lungs every 6 (six) hours as needed for wheezing or shortness of breath. 09/11/22   Tobie Suzzane POUR, MD  FLUoxetine  (PROZAC ) 20 MG capsule Take 1 capsule (20 mg total) by mouth daily. 06/20/24   Tobie Suzzane POUR, MD  fluticasone  (FLONASE ) 50 MCG/ACT nasal spray Place 2 sprays into both nostrils daily. 11/18/22   Dixon, Phillip E, MD  hydrOXYzine (VISTARIL) 25 MG capsule Take 1 capsule (25 mg total) by mouth every 8 (eight) hours as needed for anxiety. 06/20/24   Tobie Suzzane POUR, MD  ondansetron  (ZOFRAN -ODT) 4 MG disintegrating tablet Take 1 tablet (4 mg total) by mouth every 8 (eight) hours as needed for nausea or vomiting. 02/18/24   Daralene Lonni BIRCH, PA-C  pantoprazole  (PROTONIX ) 40 MG tablet TAKE 1 TABLET(40 MG) BY MOUTH DAILY 12/02/23   Dixon, Phillip E,  MD  tadalafil (CIALIS) 20 MG tablet Take 1 tablet (20 mg total) by mouth daily as needed for erectile dysfunction. 06/20/24   Tobie Suzzane POUR, MD    Allergies: Patient has no known allergies.    Review of Systems  Genitourinary:  Positive for flank pain.    Updated Vital Signs BP (!) 133/94 (BP Location: Right Arm)   Pulse 76   Temp 97.9 F (36.6 C) (Oral)   Resp 18   Ht 6' (1.829 m)   Wt 80.7 kg   SpO2 100%   BMI 24.14 kg/m   Physical Exam Vitals and nursing note reviewed.  Constitutional:      General: He is not in acute distress.    Appearance: Normal appearance.  HENT:     Head: Normocephalic and atraumatic.  Eyes:     Extraocular Movements: Extraocular movements intact.     Conjunctiva/sclera: Conjunctivae normal.     Pupils: Pupils are equal, round, and reactive to light.  Cardiovascular:     Rate and Rhythm: Normal rate and regular rhythm.     Pulses: Normal pulses.  Pulmonary:     Effort: Pulmonary effort is normal. No respiratory distress.  Abdominal:     General: Abdomen is flat.     Palpations: Abdomen is soft.     Tenderness: There is no abdominal tenderness. There is right CVA tenderness and left CVA tenderness.     Comments: CVA tenderness bilaterally.  Patient states that the left  side is significantly more painful to palpation than the right. No abdominal pain on palpation - no abdominal distension, guarding, or rebound tenderness. No obvious trauma.   Musculoskeletal:        General: Normal range of motion.     Cervical back: Normal range of motion.  Skin:    General: Skin is warm and dry.     Capillary Refill: Capillary refill takes less than 2 seconds.  Neurological:     General: No focal deficit present.     Mental Status: He is alert. Mental status is at baseline.  Psychiatric:        Mood and Affect: Mood normal.     (all labs ordered are listed, but only abnormal results are displayed) Labs Reviewed - No data to  display  EKG: None  Radiology: No results found.  {Document cardiac monitor, telemetry assessment procedure when appropriate:32947} Procedures   Medications Ordered in the ED - No data to display    {Click here for ABCD2, HEART and other calculators REFRESH Note before signing:1}                              Medical Decision Making  Patient presents to the ED for concern of ***, this involves an extensive number of treatment options, and is a complaint that carries with it a high risk of complications and morbidity.    The differential diagnosis includes: *** *** *** ***  Co morbidities that complicate the patient evaluation: *** *** *** ***  Additional history obtained:  Additional history obtained from *** {Blank multiple:19196::EMS,Family,Nursing,Outside Medical Records,Past Admission}   External records from outside source obtained and reviewed including ***  The patient (or family member) was a **reliable historian**, providing a clear, detailed, and consistent account of the presenting symptoms and relevant medical history. The information was obtained directly from the patient (or family) and statements were documented in the patient's own words when possible. No discrepancies were noted between the history provided and available collateral sources.     Lab Tests: I ordered, and personally interpreted labs.   The pertinent results include:  ***  Imaging Studies ordered: I ordered imaging studies including: *** I independently visualized and interpreted imaging which showed: *** I agree with the radiologist interpretation  Cardiac Monitoring: The patient was maintained on a cardiac monitor.  I personally viewed and interpreted the cardiac monitored which showed an underlying rhythm of: ***  Medicines ordered and prescription drug management: I ordered medications: including ***  for ***  Reevaluation of the patient after these medicines  showed that the patient {resolved/improved/worsened:23923::improved} I have reviewed the patients home medicines and have made adjustments as needed  Test Considered: Diagnostic testing was considered based on the patient's presenting symptoms, risk factors, and initial clinical assessment.  The approach to diagnostic testing prioritized exclusion of life-threatening conditions  Problem List / ED Course: Problem List: *** Emergency Department Course: The patient presented with ***. Initial assessment included history, physical exam, and review of prior medical records.  Reevaluation: After the interventions noted above, I reevaluated the patient and found that they have :{resolved/improved/worsened:23923::improved}  Dispostion: After consideration of the diagnostic results and the patients response to treatment, I feel that the patent would benefit from discharge home with follow-up with for further valuation and care of *** Clinical Assessment:    Working diagnosis: *** Disposition Plan: The patient is medically stable for discharge from the Emergency Department  at this time. Vital signs are within normal limits, and the patient is alert, oriented, and in no acute distress. Diagnostic evaluation has been completed with no findings necessitating hospital admission or further emergent workup.  Communication:   Patient and family informed of disposition decision and rationale. Questions addressed.  The diagnostic results and clinical impression were discussed with the patient and  at bedside and the patient demonstrated understanding.      {Document critical care time when appropriate  Document review of labs and clinical decision tools ie CHADS2VASC2, etc  Document your independent review of radiology images and any outside records  Document your discussion with family members, caretakers and with consultants  Document social determinants of health affecting pt's care  Document your  decision making why or why not admission, treatments were needed:32947:::1}   Final diagnoses:  None    ED Discharge Orders     None

## 2024-06-26 NOTE — ED Triage Notes (Signed)
 PT arrived by POV with CC of flank pain. PT reports flank pain that started yesterday evening and has increased since. PT reports. 8/10 pain that is primarily on the left side but also feels pian on the right. No dysuria. No anuria.  PT alert, GSC 15.

## 2024-06-28 ENCOUNTER — Ambulatory Visit: Payer: Self-pay | Admitting: Physician Assistant

## 2024-06-28 ENCOUNTER — Telehealth (INDEPENDENT_AMBULATORY_CARE_PROVIDER_SITE_OTHER): Admitting: Internal Medicine

## 2024-06-28 ENCOUNTER — Encounter: Payer: Self-pay | Admitting: Internal Medicine

## 2024-06-28 DIAGNOSIS — M5134 Other intervertebral disc degeneration, thoracic region: Secondary | ICD-10-CM | POA: Diagnosis not present

## 2024-06-28 DIAGNOSIS — G8929 Other chronic pain: Secondary | ICD-10-CM

## 2024-06-28 DIAGNOSIS — M25562 Pain in left knee: Secondary | ICD-10-CM | POA: Diagnosis not present

## 2024-06-28 DIAGNOSIS — M4306 Spondylolysis, lumbar region: Secondary | ICD-10-CM

## 2024-06-28 LAB — GC/CHLAMYDIA PROBE AMP (~~LOC~~) NOT AT ARMC
Chlamydia: NEGATIVE
Comment: NEGATIVE
Comment: NORMAL
Neisseria Gonorrhea: NEGATIVE

## 2024-06-28 MED ORDER — IBUPROFEN 800 MG PO TABS: 800.0000 mg | ORAL_TABLET | Freq: Three times a day (TID) | ORAL | 1 refills | Status: DC | PRN

## 2024-06-28 NOTE — Progress Notes (Signed)
 Virtual Visit via Video Note   Because of Matthew Andrews's co-morbid illnesses, he is at least at moderate risk for complications without adequate follow up.  This format is felt to be most appropriate for this patient at this time.  All issues noted in this document were discussed and addressed.  A limited physical exam was performed with this format.      Evaluation Performed:  Follow-up visit  Date:  06/28/2024   ID:  Matthew Andrews, DOB 12-19-1990, MRN [id number]  Patient Location: Home Provider Location: Office/Clinic  Participants: Patient Location of Patient: Home Location of Provider: Telehealth Consent was obtain for visit to be over via telehealth. I verified that I am speaking with the correct person using two identifiers.  PCP:  Bevely Doffing, FNP   Chief Complaint: Mid back pain and left knee pain  History of Present Illness:    Matthew Andrews is a 33 y.o. male who has a video visit for follow-up of ER visit on 06/26/24.  He went to ER with complaint of bilateral mid back and flank area pain for 2 days prior to the ER visit.  He initially went to [facility] ER, had UA, which showed trace RBC.  He later went to [facility] ER, had CT abdomen, which did not show nephrolithiasis.  Of note, he reports improvement in the pain currently.  He has tried taking ibuprofen  and an old pain medicine so far.  Denies any recent injury or fall.  He has history of lumbar discectomy and fusion in 2014.  He also reports left knee pain, which is chronic.  He has had meniscectomy and Baker's cyst surgery in the past.  Left knee pain is constant, sharp, worse with standing and better with ibuprofen .  He has been evaluated by orthopedic surgery in the past, but reports that his surgeon has retired now.  The patient does not have symptoms concerning for COVID-19 infection (fever, chills, cough, or new shortness of breath).   Past Medical, Surgical, Social History, Allergies, and  Medications have been Reviewed.  Past Medical History:  Diagnosis Date   Anxiety    Cholesteatoma of ear, left 2023   COVID-19 04/05/2020   RUQ pain 08/03/2019   Added automatically from request for surgery [id number]   Past Surgical History:  Procedure Laterality Date   BIOPSY  08/31/2019   Procedure: BIOPSY;  Surgeon: Golda Claudis PENNER, MD;  Location: [facility] ENDO SUITE;  Service: Endoscopy;;  duodenum, gastric   ESOPHAGOGASTRODUODENOSCOPY N/A 08/31/2019   Procedure: ESOPHAGOGASTRODUODENOSCOPY (EGD);  Surgeon: Golda Claudis PENNER, MD;  Location: [facility] ENDO SUITE;  Service: Endoscopy;  Laterality: N/A;  2:50   knee surgery Left    LUMBAR SPINE SURGERY     2014   MIDDLE EAR SURGERY Left 2023     Current Meds  Medication Sig   albuterol  (VENTOLIN  HFA) 108 (90 Base) MCG/ACT inhaler Inhale 2 puffs into the lungs every 6 (six) hours as needed for wheezing or shortness of breath.   FLUoxetine  (PROZAC ) 20 MG capsule Take 1 capsule (20 mg total) by mouth daily.   fluticasone  (FLONASE ) 50 MCG/ACT nasal spray Place 2 sprays into both nostrils daily.   hydrOXYzine (VISTARIL) 25 MG capsule Take 1 capsule (25 mg total) by mouth every 8 (eight) hours as needed for anxiety.   ondansetron  (ZOFRAN -ODT) 4 MG disintegrating tablet Take 1 tablet (4 mg total) by mouth every 8 (eight) hours as needed for nausea or vomiting.  pantoprazole  (PROTONIX ) 40 MG tablet TAKE 1 TABLET(40 MG) BY MOUTH DAILY   tadalafil (CIALIS) 20 MG tablet Take 1 tablet (20 mg total) by mouth daily as needed for erectile dysfunction.     Allergies:   Patient has no known allergies.   ROS:   Please see the history of present illness. All other systems reviewed and are negative.   Labs/Other Tests and Data Reviewed:    Recent Labs: 06/20/2024: ALT 14; BUN 12; Creatinine, Ser 0.93; Hemoglobin 14.1; Platelets 292; Potassium 3.6; Sodium 142; TSH 1.390   Recent Lipid Panel Lab Results  Component Value Date/Time   CHOL 173 04/24/2022  09:27 AM   TRIG 332 (H) 04/24/2022 09:27 AM   HDL 43 04/24/2022 09:27 AM   CHOLHDL 4.0 04/24/2022 09:27 AM   CHOLHDL 4.9 05/24/2019 04:20 PM   LDLCALC 77 04/24/2022 09:27 AM   LDLCALC 105 (H) 05/24/2019 04:20 PM    Wt Readings from Last 3 Encounters:  06/26/24 178 lb (80.7 kg)  06/20/24 178 lb 3.2 oz (80.8 kg)  02/21/24 175 lb (79.4 kg)     Objective:    Vital Signs:  There were no vitals taken for this visit.   VITAL SIGNS:  reviewed GEN:  no acute distress EYES:  sclerae anicteric, EOMI - Extraocular Movements Intact RESPIRATORY:  normal respiratory effort, symmetric expansion NEURO:  alert and oriented x 3, no obvious focal deficit PSYCH:  normal affect  ASSESSMENT & PLAN:    Discogenic thoracic pain Acute on chronic mid back pain, with mild radiation to both sides Ibuprofen  as needed for pain Avoid heavy lifting and frequent bending Check x-ray of thoracic spine  Spondylolysis, lumbar region Has a history of lumbar discectomy and fusion surgery Check x-ray of lumbar spine Ibuprofen  as needed for pain Does not tolerate muscle relaxers Heating pad and/or back brace as needed Avoid heavy lifting and frequent bending  Pain in left knee Chronic left knee pain Has history of meniscal tear s/p meniscectomy Has had Baker's cyst surgery in the past as well Referred to orthopedic surgery for further evaluation Ibuprofen  as needed for pain     I discussed the assessment and treatment plan with the patient. The patient was provided an opportunity to ask questions, and all were answered. The patient agreed with the plan and demonstrated an understanding of the instructions.   The patient was advised to call back or seek an in-person evaluation if the symptoms worsen or if the condition fails to improve as anticipated.  The above assessment and management plan was discussed with the patient. The patient verbalized understanding of and has agreed to the management plan.    Medication Adjustments/Labs and Tests Ordered: Current medicines are reviewed at length with the patient today.  Concerns regarding medicines are outlined above.   Tests Ordered: No orders of the defined types were placed in this encounter.   Medication Changes: No orders of the defined types were placed in this encounter.    Note: This dictation was prepared with Dragon dictation along with smaller phrase technology. Similar sounding words can be transcribed inadequately or may not be corrected upon review. Any transcriptional errors that result from this process are unintentional.      Disposition:  Follow up  Signed, Suzzane MARLA Blanch, MD  06/28/2024 11:42 AM     [facility]

## 2024-06-28 NOTE — Assessment & Plan Note (Signed)
 Chronic left knee pain Has history of meniscal tear s/p meniscectomy Has had Baker's cyst surgery in the past as well Referred to orthopedic surgery for further evaluation Ibuprofen  as needed for pain

## 2024-06-28 NOTE — Patient Instructions (Signed)
 Please take ibuprofen  as needed for left knee pain.  Please get x-rays of thoracic and lumbar spine done at [facility].  Please avoid heavy lifting and frequent bending.

## 2024-06-28 NOTE — Assessment & Plan Note (Signed)
 Has a history of lumbar discectomy and fusion surgery Check x-ray of lumbar spine Ibuprofen  as needed for pain Does not tolerate muscle relaxers Heating pad and/or back brace as needed Avoid heavy lifting and frequent bending

## 2024-06-28 NOTE — Assessment & Plan Note (Signed)
 Acute on chronic mid back pain, with mild radiation to both sides Ibuprofen  as needed for pain Avoid heavy lifting and frequent bending Check x-ray of thoracic spine

## 2024-07-31 DIAGNOSIS — M791 Myalgia, unspecified site: Secondary | ICD-10-CM | POA: Diagnosis not present

## 2024-07-31 DIAGNOSIS — J069 Acute upper respiratory infection, unspecified: Secondary | ICD-10-CM | POA: Diagnosis not present

## 2024-08-07 ENCOUNTER — Ambulatory Visit: Admitting: Orthopedic Surgery

## 2024-08-07 ENCOUNTER — Other Ambulatory Visit (INDEPENDENT_AMBULATORY_CARE_PROVIDER_SITE_OTHER): Payer: Self-pay

## 2024-08-07 VITALS — BP 129/91 | HR 77 | Ht 72.0 in | Wt 178.0 lb

## 2024-08-07 DIAGNOSIS — M546 Pain in thoracic spine: Secondary | ICD-10-CM

## 2024-08-07 MED ORDER — PREGABALIN 75 MG PO CAPS: 75.0000 mg | ORAL_CAPSULE | Freq: Two times a day (BID) | ORAL | 0 refills | Status: DC

## 2024-08-07 NOTE — Progress Notes (Signed)
 Orthopedic Spine Surgery Office Note  Assessment: Patient is a 33 y.o. male with thoracic back pain that radiates into the left posterior chest wall around T10   Plan: -Explained that initially conservative treatment is tried as a significant number of patients may experience relief with these treatment modalities. Discussed that the conservative treatments include:  -activity modification  -physical therapy  -over the counter pain medications  -medrol  dosepak  -lumbar steroid injections -Patient has tried ice, heat, PT, Tylenol, ibuprofen , oral steroids, muscle relaxers  -Since patient has tried over 6 weeks of conservative treatment without any relief of his symptoms, ordered a MRI of the thoracic spine to evaluate for radiculopathy -Prescribed Lyrica  as another conservative treatment to try for pain relief -Patient should return to office in 4 weeks, x-rays at next visit: none   Patient expressed understanding of the plan and all questions were answered to the patient's satisfaction.   ___________________________________________________________________________   History:  Patient is a 33 y.o. male who presents today for thoracic spine.  Patient has a history of low back pain and has previously undergone a L5/S1 spinal fusion.  He did well after that surgery.  He comes in today because for the last several years he has noticed pain in his lower thoracic spine.  He feels it radiating into the left posterior chest wall distal to the scapula.  He said will sometimes radiate along the chest wall anteriorly.  This is only sometimes though this is not as consistent.  His pain has gotten progressively worse over time.  He does not have any pain radiating into either lower extremity.  There was no trauma or injury that preceded the onset of this pain.   Weakness: Denies Symptoms of imbalance: Denies Paresthesias and numbness: Denies Bowel or bladder incontinence: Denies Saddle anesthesia:  Denies  Treatments tried: ice, heat, PT, Tylenol, ibuprofen , oral steroids, muscle relaxers  Review of systems: Denies fevers and chills, night sweats, unexplained weight loss, history of cancer.  Has had pain that wakes him at night  Past medical history: GERD Anxiety Chronic pain  Allergies: NKDA  Past surgical history:  L5/S1 spinal fusion  Left knee arthroscopy  Social history: Denies use of nicotine product (smoking, vaping, patches, smokeless) Alcohol use: denies Denies recreational drug use   Physical Exam:  BMI of 24.1  General: no acute distress, appears stated age Neurologic: alert, answering questions appropriately, following commands Respiratory: unlabored breathing on room air, symmetric chest rise Psychiatric: appropriate affect, normal cadence to speech   MSK (spine):  -Strength exam      Left  Right EHL    5/5  5/5 TA    5/5  5/5 GSC    5/5  5/5 Knee extension  5/5  5/5 Hip flexion   5/5  5/5  -Sensory exam    Sensation intact to light touch in L3-S1 nerve distributions of bilateral lower extremities  -Achilles DTR: 2/4 on the left, 2/4 on the right -Patellar tendon DTR: 2/4 on the left, 2/4 on the right  -Straight leg raise: negative bilaterally  -Clonus: no beats bilaterally  Imaging: XRs of the thoracic spine from 08/07/2024 were independently reviewed and interpreted, showing no significant degenerative changes.  No fracture or dislocation seen.  No spondylolisthesis seen.   Patient name: Matthew Andrews Patient MRN: [id number] Date of visit: 08/07/2024

## 2024-08-16 ENCOUNTER — Ambulatory Visit (HOSPITAL_COMMUNITY): Admission: RE | Admit: 2024-08-16 | Source: Ambulatory Visit

## 2024-08-21 ENCOUNTER — Ambulatory Visit (HOSPITAL_COMMUNITY): Admission: RE | Admit: 2024-08-21

## 2024-08-29 ENCOUNTER — Ambulatory Visit: Admitting: Internal Medicine

## 2024-09-01 ENCOUNTER — Ambulatory Visit

## 2024-09-12 ENCOUNTER — Other Ambulatory Visit: Payer: Self-pay | Admitting: Internal Medicine

## 2024-09-12 DIAGNOSIS — G8929 Other chronic pain: Secondary | ICD-10-CM

## 2024-09-17 ENCOUNTER — Telehealth: Payer: Self-pay

## 2024-09-17 NOTE — Telephone Encounter (Signed)
 MYCHART SENT ABOUT R/S APPT

## 2024-09-18 ENCOUNTER — Ambulatory Visit: Admitting: Orthopedic Surgery

## 2024-09-19 ENCOUNTER — Ambulatory Visit (HOSPITAL_COMMUNITY)

## 2024-09-19 ENCOUNTER — Other Ambulatory Visit: Payer: Self-pay | Admitting: Orthopedic Surgery

## 2024-09-19 DIAGNOSIS — F1111 Opioid abuse, in remission: Secondary | ICD-10-CM

## 2024-09-19 DIAGNOSIS — F4310 Post-traumatic stress disorder, unspecified: Secondary | ICD-10-CM

## 2024-10-04 ENCOUNTER — Ambulatory Visit (HOSPITAL_COMMUNITY)

## 2024-11-21 ENCOUNTER — Ambulatory Visit
